# Patient Record
Sex: Male | Born: 1965 | Race: Black or African American | Hispanic: No | Marital: Married | Smoking: Never smoker
Health system: Southern US, Community
[De-identification: ages and names within clinical notes are randomized; demographics above are authoritative.]

## PROBLEM LIST (undated history)

## (undated) DIAGNOSIS — K219 Gastro-esophageal reflux disease without esophagitis: Secondary | ICD-10-CM

## (undated) DIAGNOSIS — E785 Hyperlipidemia, unspecified: Secondary | ICD-10-CM

## (undated) DIAGNOSIS — I10 Essential (primary) hypertension: Secondary | ICD-10-CM

## (undated) HISTORY — DX: Gastro-esophageal reflux disease without esophagitis: K21.9

## (undated) HISTORY — DX: Hyperlipidemia, unspecified: E78.5

## (undated) HISTORY — DX: Essential (primary) hypertension: I10

## (undated) HISTORY — PX: UPPER GI ENDOSCOPY: SHX6162

---

## 2002-12-27 ENCOUNTER — Encounter: Admission: RE | Admit: 2002-12-27 | Discharge: 2002-12-27 | Payer: Self-pay | Admitting: Nephrology

## 2002-12-27 ENCOUNTER — Encounter: Payer: Self-pay | Admitting: Nephrology

## 2003-01-09 ENCOUNTER — Ambulatory Visit (HOSPITAL_COMMUNITY): Admission: RE | Admit: 2003-01-09 | Discharge: 2003-01-09 | Payer: Self-pay | Admitting: Nephrology

## 2003-01-09 ENCOUNTER — Encounter: Payer: Self-pay | Admitting: Nephrology

## 2003-01-14 ENCOUNTER — Ambulatory Visit (HOSPITAL_COMMUNITY): Admission: RE | Admit: 2003-01-14 | Discharge: 2003-01-14 | Payer: Self-pay | Admitting: Nephrology

## 2003-01-14 ENCOUNTER — Encounter: Payer: Self-pay | Admitting: Nephrology

## 2004-06-30 ENCOUNTER — Encounter: Admission: RE | Admit: 2004-06-30 | Discharge: 2004-06-30 | Payer: Self-pay | Admitting: Gastroenterology

## 2004-09-10 ENCOUNTER — Encounter: Admission: RE | Admit: 2004-09-10 | Discharge: 2004-09-10 | Payer: Self-pay | Admitting: Gastroenterology

## 2004-11-25 ENCOUNTER — Encounter: Admission: RE | Admit: 2004-11-25 | Discharge: 2004-11-25 | Payer: Self-pay | Admitting: Gastroenterology

## 2006-01-21 ENCOUNTER — Ambulatory Visit (HOSPITAL_COMMUNITY): Admission: RE | Admit: 2006-01-21 | Discharge: 2006-01-21 | Payer: Self-pay | Admitting: Nephrology

## 2006-01-24 ENCOUNTER — Ambulatory Visit (HOSPITAL_COMMUNITY): Admission: RE | Admit: 2006-01-24 | Discharge: 2006-01-24 | Payer: Self-pay | Admitting: Nephrology

## 2008-02-12 ENCOUNTER — Ambulatory Visit (HOSPITAL_COMMUNITY): Admission: RE | Admit: 2008-02-12 | Discharge: 2008-02-12 | Payer: Self-pay | Admitting: Internal Medicine

## 2008-10-08 ENCOUNTER — Ambulatory Visit: Payer: Self-pay | Admitting: Hematology & Oncology

## 2008-11-22 LAB — CBC WITH DIFFERENTIAL (CANCER CENTER ONLY)
BASO#: 0.1 10*3/uL (ref 0.0–0.2)
Eosinophils Absolute: 0.1 10*3/uL (ref 0.0–0.5)
LYMPH%: 45.4 % (ref 14.0–48.0)
MCH: 30.7 pg (ref 28.0–33.4)
MCV: 91 fL (ref 82–98)
MONO%: 7.1 % (ref 0.0–13.0)
Platelets: 141 10*3/uL — ABNORMAL LOW (ref 145–400)
RBC: 5.26 10*6/uL (ref 4.20–5.70)

## 2008-11-26 LAB — HEPATITIS PANEL, ACUTE
Hep A IgM: NEGATIVE
Hepatitis B Surface Ag: NEGATIVE

## 2008-11-26 LAB — PROTEIN ELECTROPHORESIS, SERUM
Albumin ELP: 58.6 % (ref 55.8–66.1)
Alpha-1-Globulin: 3.8 % (ref 2.9–4.9)
Gamma Globulin: 21.3 % — ABNORMAL HIGH (ref 11.1–18.8)
Total Protein, Serum Electrophoresis: 8.1 g/dL (ref 6.0–8.3)

## 2008-11-26 LAB — ANA: Anti Nuclear Antibody(ANA): NEGATIVE

## 2008-11-26 LAB — FOLATE RBC: RBC Folate: 524 ng/mL (ref 180–600)

## 2008-11-26 LAB — VITAMIN B12: Vitamin B-12: 1137 pg/mL — ABNORMAL HIGH (ref 211–911)

## 2009-03-27 ENCOUNTER — Ambulatory Visit: Payer: Self-pay | Admitting: Hematology & Oncology

## 2010-05-28 ENCOUNTER — Observation Stay (HOSPITAL_COMMUNITY): Admission: EM | Admit: 2010-05-28 | Discharge: 2010-05-29 | Payer: Self-pay | Admitting: Emergency Medicine

## 2010-06-19 ENCOUNTER — Ambulatory Visit: Payer: Self-pay | Admitting: Diagnostic Radiology

## 2010-06-19 ENCOUNTER — Ambulatory Visit (HOSPITAL_BASED_OUTPATIENT_CLINIC_OR_DEPARTMENT_OTHER): Admission: RE | Admit: 2010-06-19 | Discharge: 2010-06-19 | Payer: Self-pay | Admitting: Internal Medicine

## 2010-11-01 ENCOUNTER — Encounter: Payer: Self-pay | Admitting: Nephrology

## 2010-12-25 LAB — DIFFERENTIAL
Basophils Absolute: 0 10*3/uL (ref 0.0–0.1)
Basophils Relative: 0 % (ref 0–1)
Eosinophils Absolute: 0 10*3/uL (ref 0.0–0.7)
Eosinophils Relative: 0 % (ref 0–5)
Lymphocytes Relative: 17 % (ref 12–46)
Lymphs Abs: 1.1 10*3/uL (ref 0.7–4.0)
Monocytes Absolute: 0.4 10*3/uL (ref 0.1–1.0)
Monocytes Relative: 6 % (ref 3–12)
Neutro Abs: 4.9 10*3/uL (ref 1.7–7.7)
Neutrophils Relative %: 77 % (ref 43–77)

## 2010-12-25 LAB — CK TOTAL AND CKMB (NOT AT ARMC)
CK, MB: 2.3 ng/mL (ref 0.3–4.0)
Relative Index: 0.7 (ref 0.0–2.5)
Total CK: 315 U/L — ABNORMAL HIGH (ref 7–232)

## 2010-12-25 LAB — CBC
HCT: 43.8 % (ref 39.0–52.0)
HCT: 44.8 % (ref 39.0–52.0)
Hemoglobin: 15.6 g/dL (ref 13.0–17.0)
Hemoglobin: 16.1 g/dL (ref 13.0–17.0)
MCH: 31 pg (ref 26.0–34.0)
MCH: 31.2 pg (ref 26.0–34.0)
MCHC: 35.6 g/dL (ref 30.0–36.0)
MCHC: 35.9 g/dL (ref 30.0–36.0)
MCV: 86.8 fL (ref 78.0–100.0)
MCV: 86.9 fL (ref 78.0–100.0)
Platelets: 125 10*3/uL — ABNORMAL LOW (ref 150–400)
Platelets: 131 10*3/uL — ABNORMAL LOW (ref 150–400)
RBC: 5.04 MIL/uL (ref 4.22–5.81)
RBC: 5.16 MIL/uL (ref 4.22–5.81)
RDW: 12.4 % (ref 11.5–15.5)
RDW: 12.5 % (ref 11.5–15.5)
WBC: 4.5 10*3/uL (ref 4.0–10.5)
WBC: 6.4 10*3/uL (ref 4.0–10.5)

## 2010-12-25 LAB — BASIC METABOLIC PANEL
BUN: 12 mg/dL (ref 6–23)
CO2: 29 mEq/L (ref 19–32)
Calcium: 9.1 mg/dL (ref 8.4–10.5)
Chloride: 102 mEq/L (ref 96–112)
Creatinine, Ser: 0.92 mg/dL (ref 0.4–1.5)
GFR calc Af Amer: >60 mL/min (ref >60)
GFR calc non Af Amer: >60 mL/min (ref >60)
Glucose, Bld: 98 mg/dL (ref 70–99)
Potassium: 3.1 mEq/L — ABNORMAL LOW (ref 3.5–5.1)
Sodium: 137 mEq/L (ref 135–145)

## 2010-12-25 LAB — COMPREHENSIVE METABOLIC PANEL
AST: 25 U/L (ref 0–37)
Albumin: 4 g/dL (ref 3.5–5.2)
Calcium: 9.2 mg/dL (ref 8.4–10.5)
Creatinine, Ser: 0.79 mg/dL (ref 0.4–1.5)
GFR calc Af Amer: >60 mL/min (ref >60)
GFR calc non Af Amer: >60 mL/min (ref >60)
Sodium: 141 mEq/L (ref 135–145)
Total Protein: 6.8 g/dL (ref 6.0–8.3)

## 2010-12-25 LAB — PROTIME-INR
INR: 1.18 (ref 0.00–1.49)
Prothrombin Time: 15.2 seconds (ref 11.6–15.2)

## 2010-12-25 LAB — LIPID PANEL
Cholesterol: 177 mg/dL (ref 0–200)
HDL: 76 mg/dL (ref >39)
LDL Cholesterol: 96 mg/dL (ref 0–99)
Total CHOL/HDL Ratio: 2.3 RATIO
Triglycerides: 25 mg/dL (ref <150)
VLDL: 5 mg/dL (ref 0–40)

## 2010-12-25 LAB — LIPASE, BLOOD: Lipase: 31 U/L (ref 11–59)

## 2010-12-25 LAB — MAGNESIUM: Magnesium: 2 mg/dL (ref 1.5–2.5)

## 2010-12-25 LAB — MRSA PCR SCREENING: MRSA by PCR: NEGATIVE

## 2010-12-25 LAB — APTT: aPTT: 26 seconds (ref 24–37)

## 2010-12-25 LAB — AMYLASE: Amylase: 87 U/L (ref 0–105)

## 2010-12-25 LAB — TROPONIN I: Troponin I: 0.01 ng/mL (ref 0.00–0.06)

## 2011-02-18 ENCOUNTER — Other Ambulatory Visit: Payer: Self-pay | Admitting: Internal Medicine

## 2011-02-18 ENCOUNTER — Ambulatory Visit
Admission: RE | Admit: 2011-02-18 | Discharge: 2011-02-18 | Disposition: A | Discharge status: Home / Self Care | Payer: 59 | Source: Ambulatory Visit | Attending: Internal Medicine | Admitting: Internal Medicine

## 2011-02-18 DIAGNOSIS — R945 Abnormal results of liver function studies: Secondary | ICD-10-CM

## 2011-02-18 MED ORDER — GADOBENATE DIMEGLUMINE 529 MG/ML IV SOLN
14.0000 mL | Freq: Once | INTRAVENOUS | Status: AC | PRN
  Administered 2011-02-18: 14 mL via INTRAVENOUS

## 2012-05-16 ENCOUNTER — Ambulatory Visit (INDEPENDENT_AMBULATORY_CARE_PROVIDER_SITE_OTHER): Payer: 59 | Admitting: Family Medicine

## 2012-05-16 VITALS — BP 152/98 | HR 91 | Temp 98.2°F | Resp 20 | Ht 63.5 in | Wt 159.0 lb

## 2012-05-16 DIAGNOSIS — N509 Disorder of male genital organs, unspecified: Secondary | ICD-10-CM

## 2012-05-16 DIAGNOSIS — N50819 Testicular pain, unspecified: Secondary | ICD-10-CM

## 2012-05-16 MED ORDER — PREDNISONE 20 MG PO TABS: ORAL_TABLET | ORAL | Status: DC

## 2012-05-16 MED ORDER — CIPROFLOXACIN HCL 500 MG PO TABS: 500.0000 mg | ORAL_TABLET | Freq: Two times a day (BID) | ORAL | Status: DC
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
## 2012-05-21 ENCOUNTER — Ambulatory Visit (INDEPENDENT_AMBULATORY_CARE_PROVIDER_SITE_OTHER): Payer: 59 | Admitting: Family Medicine

## 2012-05-21 VITALS — BP 166/91 | HR 96 | Temp 98.3°F | Resp 18 | Ht 63.0 in | Wt 154.0 lb

## 2012-05-21 DIAGNOSIS — R05 Cough: Secondary | ICD-10-CM

## 2012-05-21 DIAGNOSIS — R059 Cough, unspecified: Secondary | ICD-10-CM

## 2012-05-21 DIAGNOSIS — K219 Gastro-esophageal reflux disease without esophagitis: Secondary | ICD-10-CM

## 2012-05-21 MED ORDER — ESOMEPRAZOLE MAGNESIUM 40 MG PO CPDR: 40.0000 mg | DELAYED_RELEASE_CAPSULE | Freq: Every day | ORAL | Status: DC

## 2012-05-21 MED ORDER — HYDROCODONE-HOMATROPINE 5-1.5 MG/5ML PO SYRP: 5.0000 mL | ORAL_SOLUTION | Freq: Three times a day (TID) | ORAL | Status: AC | PRN

## 2012-05-21 MED ORDER — METHYLPREDNISOLONE ACETATE 80 MG/ML IJ SUSP
80.0000 mg | Freq: Once | INTRAMUSCULAR | Status: AC
  Administered 2012-05-21: 80 mg via INTRAMUSCULAR

## 2012-05-21 NOTE — Progress Notes (Signed)
@[facility] @   Patient ID: Andrew Andrade MRN: [id number], DOB: 1966/01/12, 46 y.o. Date of Encounter: 05/21/2012, 2:59 PM  Primary Physician: No primary provider on file.  Chief Complaint:  Chief Complaint  Patient presents with  . Cough    with congestion and sneezing    HPI: 46 y.o. year old male presents with a 47 day history of nasal congestion, post nasal drip, sore throat, and cough. Mild sinus pressure. Afebrile. No chills. Nasal congestion thick and green/yellow. Cough is productive of green/yellow sputum and not associated with time of day. Ears feel full, leading to sensation of muffled hearing. Has tried OTC cold preps without success. No GI complaints. Appetite fair.  He has reflux symptoms, particularly severe yesterday.  No missed days of work.  No sick contacts, recent antibiotics, or recent travels.   No leg trauma, sedentary periods, h/o cancer, or tobacco use.  No past medical history on file.   Home Meds: Prior to Admission medications   Medication Sig Start Date End Date Taking? Authorizing Provider  amLODipine (NORVASC) 10 MG tablet Take 10 mg by mouth daily.   Yes Historical Provider, MD  atorvastatin (LIPITOR) 20 MG tablet Take 20 mg by mouth daily.   Yes Historical Provider, MD  HYDROcodone-acetaminophen (NORCO) 10-325 MG per tablet Take 1 tablet by mouth every 6 (six) hours as needed.   Yes Historical Provider, MD  losartan (COZAAR) 50 MG tablet Take 50 mg by mouth daily.   Yes Historical Provider, MD  pantoprazole (PROTONIX) 40 MG tablet Take 40 mg by mouth daily.   Yes Historical Provider, MD  ciprofloxacin (CIPRO) 500 MG tablet Take 1 tablet (500 mg total) by mouth 2 (two) times daily. 05/16/12 05/26/12  Elvina Sidle, MD  doxycycline (ORACEA) 40 MG capsule Take 100 mg by mouth every morning.    Historical Provider, MD  predniSONE (DELTASONE) 20 MG tablet 2 daily with food 05/16/12 05/26/12  Elvina Sidle, MD  promethazine (PHENERGAN) 12.5 MG tablet Take 12.5  mg by mouth every 6 (six) hours as needed.    Historical Provider, MD    Allergies: No Known Allergies  History   Social History  . Marital Status: Married    Spouse Name: N/A    Number of Children: N/A  . Years of Education: N/A   Occupational History  . Not on file.   Social History Main Topics  . Smoking status: Never Smoker   . Smokeless tobacco: Not on file  . Alcohol Use: Not on file  . Drug Use: Not on file  . Sexually Active: Not on file   Other Topics Concern  . Not on file   Social History Narrative  . No narrative on file     Review of Systems: Constitutional: negative for chills, fever, night sweats or weight changes Cardiovascular: negative for chest pain or palpitations Respiratory: negative for hemoptysis, wheezing, or shortness of breath Abdominal: negative for abdominal pain, nausea, vomiting or diarrhea Dermatological: negative for rash Neurologic: negative for headache   Physical Exam:  150/92 left arm sitting Blood pressure 166/91, pulse 96, temperature 98.3 F (36.8 C), temperature source Oral, resp. rate 18, height 5\' 3"  (1.6 m), weight 154 lb (69.854 kg)., Body mass index is 27.28 kg/(m^2). General: Well developed, well nourished, in no acute distress. Head: Normocephalic, atraumatic, eyes without discharge, sclera non-icteric, nares are congested. Bilateral auditory canals clear, TM's are without perforation, pearly grey with reflective cone of light bilaterally. No sinus TTP. Oral cavity moist, dentition  normal. Posterior pharynx with post nasal drip and mild erythema. No peritonsillar abscess or tonsillar exudate. Neck: Supple. No thyromegaly. Full ROM. No lymphadenopathy. Lungs: Coarse breath sounds bilaterally without wheezes, rales, or rhonchi. Breathing is unlabored.  Heart: RRR with S1 S2. No murmurs, rubs, or gallops appreciated. Msk:  Strength and tone normal for age. Extremities: No clubbing or cyanosis. No edema. Neuro: Alert and  oriented X 3. Moves all extremities spontaneously. CNII-XII grossly in tact. Psych:  Responds to questions appropriately with a normal affect.   Labs:   ASSESSMENT AND PLAN:  46 y.o. year old male with bronchitis. 1. Cough  methylPREDNISolone acetate (DEPO-MEDROL) injection 80 mg, HYDROcodone-homatropine (HYCODAN) 5-1.5 MG/5ML syrup  2. GERD (gastroesophageal reflux disease)  esomeprazole (NEXIUM) 40 MG capsule     -Tylenol/Motrin prn -Rest/fluids -RTC precautions -RTC 3-5 days if no improvement  Signed, Elvina Sidle, MD 05/21/2012 2:59 PM

## 2013-01-29 ENCOUNTER — Ambulatory Visit (HOSPITAL_COMMUNITY): Admit: 2013-01-29 | Payer: Self-pay | Admitting: Gastroenterology

## 2013-01-29 ENCOUNTER — Encounter (HOSPITAL_COMMUNITY): Payer: Self-pay

## 2013-01-29 SURGERY — MANOMETRY, ESOPHAGUS

## 2013-02-01 ENCOUNTER — Ambulatory Visit (INDEPENDENT_AMBULATORY_CARE_PROVIDER_SITE_OTHER): Payer: 59 | Admitting: Family Medicine

## 2013-02-01 VITALS — BP 148/88 | HR 84 | Temp 98.5°F | Resp 16 | Ht 63.0 in | Wt 155.0 lb

## 2013-02-01 DIAGNOSIS — J0100 Acute maxillary sinusitis, unspecified: Secondary | ICD-10-CM

## 2013-02-01 DIAGNOSIS — J309 Allergic rhinitis, unspecified: Secondary | ICD-10-CM

## 2013-02-01 MED ORDER — FLUTICASONE PROPIONATE 50 MCG/ACT NA SUSP: NASAL | Status: DC

## 2013-02-01 MED ORDER — PREDNISONE 20 MG PO TABS: ORAL_TABLET | ORAL | Status: DC

## 2013-02-01 MED ORDER — AMOXICILLIN-POT CLAVULANATE 875-125 MG PO TABS: 1.0000 | ORAL_TABLET | Freq: Two times a day (BID) | ORAL | Status: DC

## 2013-02-01 MED ORDER — LORATADINE 10 MG PO TABS: 10.0000 mg | ORAL_TABLET | Freq: Every day | ORAL | Status: DC

## 2013-02-01 NOTE — Patient Instructions (Addendum)
Allergic Rhinitis  Allergic rhinitis is when the mucous membranes in the nose respond to allergens. Allergens are particles in the air that cause your body to have an allergic reaction. This causes you to release allergic antibodies. Through a chain of events, these eventually cause you to release histamine into the blood stream (hence the use of antihistamines). Although meant to be protective to the body, it is this release that causes your discomfort, such as frequent sneezing, congestion and an itchy runny nose.    CAUSES    The pollen allergens may come from grasses, trees, and weeds. This is seasonal allergic rhinitis, or "hay fever." Other allergens cause year-round allergic rhinitis (perennial allergic rhinitis) such as house dust mite allergen, pet dander and mold spores.    SYMPTOMS     Nasal stuffiness (congestion).   Runny, itchy nose with sneezing and tearing of the eyes.   There is often an itching of the mouth, eyes and ears.  It cannot be cured, but it can be controlled with medications.  DIAGNOSIS    If you are unable to determine the offending allergen, skin or blood testing may find it.  TREATMENT     Avoid the allergen.   Medications and allergy shots (immunotherapy) can help.   Hay fever may often be treated with antihistamines in pill or nasal spray forms. Antihistamines block the effects of histamine. There are over-the-counter medicines that may help with nasal congestion and swelling around the eyes. Check with your caregiver before taking or giving this medicine.  If the treatment above does not work, there are many new medications your caregiver can prescribe. Stronger medications may be used if initial measures are ineffective. Desensitizing injections can be used if medications and avoidance fails. Desensitization is when a patient is given ongoing shots until the body becomes less sensitive to the allergen. Make sure you follow up with your caregiver if problems continue.   SEEK MEDICAL CARE IF:     You develop fever (more than 100.5 F (38.1 C).   You develop a cough that does not stop easily (persistent).   You have shortness of breath.   You start wheezing.   Symptoms interfere with normal daily activities.  Document Released: 06/22/2001 Document Revised: 12/20/2011 Document Reviewed: 01/01/2009  Exit[employer] Patient Information 2013 [employer].

## 2013-02-01 NOTE — Progress Notes (Signed)
[address]   [place], [place]  [address]   [id number]  Subjective:    Patient ID: Andrew Andrade, male    DOB: Mar 06, 1966, 47 y.o.   MRN: [id number]  HPI This 47 y.o. male presents for evaluation of congestion.  +scratachy throat, coughing.  Onset two weeks ago.  No fever but +chills/sweats.  +HA intermittent.  R ear pain; ear congested.  +nasal congestion; mucous thick yellow.  Intermittent rhinorrhea.  +chest congestion.  No v/d.  No achy.  No medications yet for symptoms.  No tobacco.  Works for [employer].  Outside all day.  +sneezing; +itchy eyes and nose.  Similar symptoms every year.   Review of Systems  Constitutional: Positive for chills, diaphoresis and fatigue. Negative for fever.  HENT: Positive for congestion, rhinorrhea, sneezing, voice change and postnasal drip. Negative for ear pain, sore throat and trouble swallowing.   Respiratory: Positive for cough. Negative for shortness of breath, wheezing and stridor.   Gastrointestinal: Negative for nausea, vomiting, abdominal pain and diarrhea.  Skin: Negative for rash.  Neurological: Positive for headaches. Negative for dizziness and light-headedness.        Past Medical History  Diagnosis Date  . Hyperlipidemia   . Hypertension   . GERD (gastroesophageal reflux disease)     History reviewed. No pertinent past surgical history.  Prior to Admission medications   Medication Sig Start Date End Date Taking? Authorizing Provider  amLODipine (NORVASC) 10 MG tablet Take 10 mg by mouth daily.   Yes Historical Provider, MD  atorvastatin (LIPITOR) 20 MG tablet Take 20 mg by mouth daily.   Yes Historical Provider, MD  olmesartan (BENICAR) 20 MG tablet Take 20 mg by mouth daily.   Yes Historical Provider, MD  pantoprazole (PROTONIX) 40 MG tablet Take 40 mg by mouth 2 (two) times daily.   Yes Historical Provider, MD  amoxicillin-clavulanate (AUGMENTIN) 875-125 MG per tablet Take 1 tablet by mouth 2 (two) times  daily. 02/01/13   Ethelda Chick, MD  fluticasone Aleda Grana) 50 MCG/ACT nasal spray 2 sprays into each nostril daily for allergies 02/01/13   Ethelda Chick, MD  loratadine (CLARITIN) 10 MG tablet Take 1 tablet (10 mg total) by mouth daily. 02/01/13   Ethelda Chick, MD  predniSONE (DELTASONE) 20 MG tablet One pill two times daily x 5 days 02/01/13   Ethelda Chick, MD    No Known Allergies  History   Social History  . Marital Status: Married    Spouse Name: N/A    Number of Children: N/A  . Years of Education: N/A   Occupational History  . Not on file.   Social History Main Topics  . Smoking status: Never Smoker   . Smokeless tobacco: Not on file  . Alcohol Use: Not on file  . Drug Use: Not on file  . Sexually Active: Yes   Other Topics Concern  . Not on file   Social History Narrative   Marital status: married. From [place]; moved to [place] 25 years ago.      Children:  4 daughters; 1 grandchild.      Lives: with wife, youngest daughter.      Employment: works for city of [employer] x 18 years.    History reviewed. No pertinent family history.  Objective:   Physical Exam  Nursing note and vitals reviewed. Constitutional: He is oriented to person, place, and time. He appears well-developed and well-nourished. No distress.  HENT:  Head: Normocephalic and atraumatic.  Right Ear: External ear normal.  Left Ear: External ear normal.  Nose: Mucosal edema and rhinorrhea present. Right sinus exhibits maxillary sinus tenderness. Right sinus exhibits no frontal sinus tenderness. Left sinus exhibits maxillary sinus tenderness. Left sinus exhibits no frontal sinus tenderness.  Mouth/Throat: Oropharynx is clear and moist and mucous membranes are normal.  Eyes: Conjunctivae and EOM are normal. Pupils are equal, round, and reactive to light.  Neck: Normal range of motion. Neck supple.  Cardiovascular: Normal rate, regular rhythm and normal heart sounds.   No murmur  heard. Pulmonary/Chest: Effort normal and breath sounds normal. No respiratory distress. He has no wheezes. He has no rales.  Lymphadenopathy:    He has no cervical adenopathy.  Neurological: He is alert and oriented to person, place, and time.  Skin: He is not diaphoretic.  Psychiatric: He has a normal mood and affect. His behavior is normal.       Results for orders placed in visit on 02/01/13  GLUCOSE, POCT (MANUAL RESULT ENTRY)      Result Value Range   POC Glucose 136 (*) 70 - 99 mg/dl    Assessment & Plan:  Allergic rhinitis - Plan: predniSONE (DELTASONE) 20 MG tablet, fluticasone (FLONASE) 50 MCG/ACT nasal spray, loratadine (CLARITIN) 10 MG tablet, POCT glucose (manual entry)  Sinusitis, acute maxillary - Plan: amoxicillin-clavulanate (AUGMENTIN) 875-125 MG per tablet, predniSONE (DELTASONE) 20 MG tablet    1.  Allergic Rhinitis:  New/uncontrolled; rx for Claritin, Flonase, Prednisone. 2.  Acute maxillary sinusitis:  New.  Rx for Augmentin, Prednisone provided. Supportive care with rest, fluids, Tylenol.   Meds ordered this encounter  Medications  . pantoprazole (PROTONIX) 40 MG tablet    Sig: Take 40 mg by mouth 2 (two) times daily.  Marland Kitchen olmesartan (BENICAR) 20 MG tablet    Sig: Take 20 mg by mouth daily.  Marland Kitchen amoxicillin-clavulanate (AUGMENTIN) 875-125 MG per tablet    Sig: Take 1 tablet by mouth 2 (two) times daily.    Dispense:  20 tablet    Refill:  0  . predniSONE (DELTASONE) 20 MG tablet    Sig: One pill two times daily x 5 days    Dispense:  10 tablet    Refill:  0  . fluticasone (FLONASE) 50 MCG/ACT nasal spray    Sig: 2 sprays into each nostril daily for allergies    Dispense:  16 g    Refill:  6  . loratadine (CLARITIN) 10 MG tablet    Sig: Take 1 tablet (10 mg total) by mouth daily.    Dispense:  30 tablet    Refill:  11

## 2014-03-26 ENCOUNTER — Ambulatory Visit (INDEPENDENT_AMBULATORY_CARE_PROVIDER_SITE_OTHER): Payer: 59 | Admitting: Family Medicine

## 2014-03-26 VITALS — BP 146/92 | HR 81 | Temp 97.8°F | Resp 16 | Ht 63.0 in | Wt 148.6 lb

## 2014-03-26 DIAGNOSIS — S7011XA Contusion of right thigh, initial encounter: Secondary | ICD-10-CM

## 2014-03-26 DIAGNOSIS — S7010XA Contusion of unspecified thigh, initial encounter: Secondary | ICD-10-CM
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
## 2014-03-26 NOTE — Patient Instructions (Signed)
Please apply heat at least every 4 hours while you are awake Take ibuprofen 2-3 tablets every 8 hours for inflammation for the next 24 hours then as needed. Please return to if pain is worse, you develop a fever, or if your leg becomes hot or red.

## 2014-08-26 ENCOUNTER — Ambulatory Visit (INDEPENDENT_AMBULATORY_CARE_PROVIDER_SITE_OTHER): Payer: 59 | Admitting: Internal Medicine

## 2014-08-26 ENCOUNTER — Ambulatory Visit (INDEPENDENT_AMBULATORY_CARE_PROVIDER_SITE_OTHER): Payer: 59

## 2014-08-26 VITALS — BP 136/88 | HR 68 | Temp 98.4°F | Resp 18 | Wt 158.0 lb

## 2014-08-26 DIAGNOSIS — M25512 Pain in left shoulder: Secondary | ICD-10-CM

## 2014-08-26 MED ORDER — MELOXICAM 15 MG PO TABS: 15.0000 mg | ORAL_TABLET | Freq: Every day | ORAL | Status: DC
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
## 2015-07-29 ENCOUNTER — Other Ambulatory Visit: Payer: Self-pay | Admitting: Gastroenterology

## 2015-07-29 DIAGNOSIS — R1310 Dysphagia, unspecified: Secondary | ICD-10-CM

## 2015-08-01 ENCOUNTER — Ambulatory Visit
Admission: RE | Admit: 2015-08-01 | Discharge: 2015-08-01 | Disposition: A | Discharge status: Home / Self Care | Payer: Self-pay | Source: Ambulatory Visit | Attending: Gastroenterology | Admitting: Gastroenterology

## 2015-08-01 DIAGNOSIS — R1310 Dysphagia, unspecified: Secondary | ICD-10-CM

## 2015-09-10 ENCOUNTER — Encounter (HOSPITAL_COMMUNITY)
Admission: RE | Disposition: A | Discharge status: Home / Self Care | Payer: Self-pay | Source: Ambulatory Visit | Attending: Gastroenterology

## 2015-09-10 ENCOUNTER — Ambulatory Visit (HOSPITAL_COMMUNITY)
Admission: RE | Admit: 2015-09-10 | Discharge: 2015-09-10 | Disposition: A | Discharge status: Home / Self Care | Payer: Commercial Managed Care - HMO | Source: Ambulatory Visit | Attending: Gastroenterology | Admitting: Gastroenterology

## 2015-09-10 DIAGNOSIS — K222 Esophageal obstruction: Secondary | ICD-10-CM | POA: Diagnosis not present

## 2015-09-10 DIAGNOSIS — K220 Achalasia of cardia: Secondary | ICD-10-CM | POA: Diagnosis not present

## 2015-09-10 DIAGNOSIS — R1310 Dysphagia, unspecified: Secondary | ICD-10-CM | POA: Diagnosis present

## 2015-09-10 HISTORY — PX: ESOPHAGEAL MANOMETRY: SHX5429

## 2015-09-10 SURGERY — MANOMETRY, ESOPHAGUS

## 2015-09-10 MED ORDER — LIDOCAINE VISCOUS 2 % MT SOLN
OROMUCOSAL | Status: AC
  Filled 2015-09-10: qty 15

## 2015-09-10 SURGICAL SUPPLY — 2 items
FACESHIELD LNG OPTICON STERILE (SAFETY) IMPLANT
GLOVE BIO SURGEON STRL SZ8 (GLOVE) ×6 IMPLANT

## 2015-09-11 ENCOUNTER — Encounter (HOSPITAL_COMMUNITY): Payer: Self-pay | Admitting: Gastroenterology

## 2015-11-27 ENCOUNTER — Ambulatory Visit (INDEPENDENT_AMBULATORY_CARE_PROVIDER_SITE_OTHER): Payer: Commercial Managed Care - HMO | Admitting: Family Medicine

## 2015-11-27 VITALS — BP 167/87 | HR 89 | Temp 101.2°F | Resp 20 | Ht 63.0 in | Wt 157.8 lb

## 2015-11-27 DIAGNOSIS — R6889 Other general symptoms and signs: Secondary | ICD-10-CM | POA: Diagnosis not present

## 2015-11-27 DIAGNOSIS — R05 Cough: Secondary | ICD-10-CM | POA: Diagnosis not present

## 2015-11-27 DIAGNOSIS — R0981 Nasal congestion: Secondary | ICD-10-CM

## 2015-11-27 DIAGNOSIS — R059 Cough, unspecified: Secondary | ICD-10-CM

## 2015-11-27 DIAGNOSIS — R52 Pain, unspecified: Secondary | ICD-10-CM | POA: Diagnosis not present

## 2015-11-27 LAB — POCT INFLUENZA A/B
INFLUENZA B, POC: NEGATIVE
Influenza A, POC: NEGATIVE

## 2015-11-27 MED ORDER — HYDROCODONE-HOMATROPINE 5-1.5 MG/5ML PO SYRP: 5.0000 mL | ORAL_SOLUTION | ORAL | Status: DC | PRN

## 2015-11-27 NOTE — Patient Instructions (Signed)
Drink lots of fluids, including water, juice, soda.  Get plenty of rest and stay off work until Monday  Take acetaminophen (Tylenol) 500 mg 2 pills every 8 hours as needed for fever and aching  If you need something more still for the fever or body aches you can also take ibuprofen (Advil) 200 mg 3 pills every 8 hours with food for fever and aching.  Hycodan 1 teaspoon every 4-6 hours as needed for cough  Take over-the-counter Claritin-D (loratadine D) 1 daily as needed for head congestion  Return if worse

## 2015-11-27 NOTE — Progress Notes (Signed)
Patient ID: Andrew Andrade, male    DOB: Sep 12, 1966  Age: 50 y.o. MRN: [id number]  Chief Complaint  Patient presents with  . Other    chest tightness , x 4 days  . Chills  . Fever  . Nasal Congestion    Subjective:   -year-old man who is here with a history of having been sick since Tuesday. He has had fever, body aches, head congestion and cough. He is not coughing up anything. He does not smoke. He works with [employer]. He worked last night. He says his wife was sick but she is well now. He did get a flu shot this year through the job.  Current allergies, medications, problem list, past/family and social histories reviewed.  Objective:  BP 167/87 mmHg  Pulse 89  Temp(Src) 101.2 F (38.4 C) (Oral)  Resp 20  Ht  (1.6 m)  Wt 157 lb 12.8 oz (71.578 kg)  BMI 27.96 kg/m2  SpO2 98%  Ill-appearing. Warm to touch. TMs normal. Throat clear. Nasal swab taken. Neck supple without significant nodes. Chest clear to auscultation. Heart regular without murmur.  Results for orders placed or performed in visit on 11/27/15  POCT Influenza A/B  Result Value Ref Range   Influenza A, POC Negative Negative   Influenza B, POC Negative Negative     Assessment & Plan:   Assessment: 1. Flu-like symptoms   2. Cough   3. Body aches   4. Nasal congestion       Plan: Flu swab for the flulike illness.  Orders Placed This Encounter  Procedures  . POCT Influenza A/B    Meds ordered this encounter  Medications  . HYDROcodone-homatropine (HYCODAN) 5-1.5 MG/5ML syrup    Sig: Take 5 mLs by mouth every 4 (four) hours as needed.    Dispense:  120 mL    Refill:  0         Patient Instructions  Drink lots of fluids, including water, juice, soda.  Get plenty of rest and stay off work until Monday  Take acetaminophen (Tylenol) 500 mg 2 pills every 8 hours as needed for fever and aching  If you need something more still for the fever or body aches you can also take ibuprofen  (Advil) 200 mg 3 pills every 8 hours with food for fever and aching.  Hycodan 1 teaspoon every 4-6 hours as needed for cough  Take over-the-counter Claritin-D (loratadine D) 1 daily as needed for head congestion  Return if worse     Return if symptoms worsen or fail to improve.   HOPPER,DAVID, MD 11/27/2015

## 2017-01-06 ENCOUNTER — Ambulatory Visit (HOSPITAL_COMMUNITY)
Admission: EM | Admit: 2017-01-06 | Discharge: 2017-01-06 | Disposition: A | Discharge status: Home / Self Care | Payer: Commercial Managed Care - HMO | Attending: Family Medicine | Admitting: Family Medicine

## 2017-01-06 ENCOUNTER — Encounter (HOSPITAL_COMMUNITY): Payer: Self-pay | Admitting: Emergency Medicine

## 2017-01-06 DIAGNOSIS — R109 Unspecified abdominal pain: Secondary | ICD-10-CM | POA: Diagnosis not present

## 2017-01-06 LAB — POCT URINALYSIS DIP (DEVICE)
BILIRUBIN URINE: NEGATIVE
GLUCOSE, UA: NEGATIVE mg/dL
Ketones, ur: NEGATIVE mg/dL
LEUKOCYTES UA: NEGATIVE
NITRITE: NEGATIVE
Protein, ur: NEGATIVE mg/dL
Specific Gravity, Urine: 1.015 (ref 1.005–1.030)
UROBILINOGEN UA: 0.2 mg/dL (ref 0.0–1.0)
pH: 7.5 (ref 5.0–8.0)

## 2017-01-06 MED ORDER — NAPROXEN 500 MG PO TABS: 500.0000 mg | ORAL_TABLET | Freq: Two times a day (BID) | ORAL | 0 refills | Status: DC

## 2017-01-06 MED ORDER — PANTOPRAZOLE SODIUM 40 MG PO TBEC: 40.0000 mg | DELAYED_RELEASE_TABLET | Freq: Two times a day (BID) | ORAL | 0 refills | Status: DC

## 2017-01-06 NOTE — ED Triage Notes (Signed)
Here for constant left flank pain onset 1 week... sts pain is constant and increases more when he bends over  Denies f/v/n/d  LBM = today... Did not notice any abnormalities  A&O x4... NAD

## 2017-01-06 NOTE — ED Provider Notes (Signed)
CSN: [id number]     Arrival date & time 01/06/17  1157 History   First MD Initiated Contact with Patient 01/06/17 1213     Chief Complaint  Patient presents with  . Flank Pain   (Consider location/radiation/quality/duration/timing/severity/associated sxs/prior Treatment) Patient c/o left flank pain for a week.  He states it hurts more with movement.   The history is provided by the patient.  Flank Pain  This is a new problem. The current episode started more than 1 week ago. The problem occurs constantly. The problem has not changed since onset.Nothing aggravates the symptoms. Nothing relieves the symptoms. He has tried nothing for the symptoms.    Past Medical History:  Diagnosis Date  . GERD (gastroesophageal reflux disease)   . Hyperlipidemia   . Hypertension    Past Surgical History:  Procedure Laterality Date  . ESOPHAGEAL MANOMETRY N/A 09/10/2015   Procedure: ESOPHAGEAL MANOMETRY (EM);  Surgeon: Carman ChingJames Edwards, MD;  Location: [facility];  Service: Endoscopy;  Laterality: N/A;   History reviewed. No pertinent family history. Social History  Substance Use Topics  . Smoking status: Never Smoker  . Smokeless tobacco: Never Used  . Alcohol use No    Review of Systems  Constitutional: Negative.   HENT: Negative.   Eyes: Negative.   Respiratory: Negative.   Cardiovascular: Negative.   Gastrointestinal: Negative.   Endocrine: Negative.   Genitourinary: Positive for flank pain.  Musculoskeletal: Positive for arthralgias.  Allergic/Immunologic: Negative.   Neurological: Negative.   Hematological: Negative.   Psychiatric/Behavioral: Negative.     Allergies  Patient has no known allergies.  Home Medications   Prior to Admission medications   Medication Sig Start Date End Date Taking? Authorizing Provider  amLODipine (NORVASC) 10 MG tablet Take 10 mg by mouth daily.   Yes Historical Provider, MD  atorvastatin (LIPITOR) 20 MG tablet Take 20 mg by mouth daily.  Reported on 11/27/2015   Yes Historical Provider, MD  pantoprazole (PROTONIX) 40 MG tablet Take 40 mg by mouth 2 (two) times daily.   Yes Historical Provider, MD  HYDROcodone-homatropine (HYCODAN) 5-1.5 MG/5ML syrup Take 5 mLs by mouth every 4 (four) hours as needed. 11/27/15   Peyton Najjaravid H Hopper, MD  meloxicam (MOBIC) 15 MG tablet Take 1 tablet (15 mg total) by mouth daily. Patient not taking: Reported on 11/27/2015 08/26/14   Tonye Pearsonobert P Doolittle, MD  naproxen (NAPROSYN) 500 MG tablet Take 1 tablet (500 mg total) by mouth 2 (two) times daily with a meal. 01/06/17   Deatra CanterWilliam J Alnita Aybar, FNP  olmesartan (BENICAR) 20 MG tablet Take 20 mg by mouth daily.    Historical Provider, MD   Meds Ordered and Administered this Visit  Medications - No data to display  BP (!) 146/69 (BP Location: Right Arm)   Pulse 74   Temp 98.2 F (36.8 C) (Oral)   Resp 16   SpO2 100%  No data found.   Physical Exam  Constitutional: He is oriented to person, place, and time. He appears well-developed and well-nourished.  HENT:  Head: Normocephalic and atraumatic.  Eyes: EOM are normal. Pupils are equal, round, and reactive to light.  Neck: Normal range of motion. Neck supple.  Cardiovascular: Normal rate, regular rhythm and normal heart sounds.   Pulmonary/Chest: Effort normal and breath sounds normal.  Abdominal: Soft. Bowel sounds are normal.  Musculoskeletal: He exhibits tenderness.  TTP left hip and left abdominal muscles.  Neurological: He is alert and oriented to person, place, and time.  Nursing  note and vitals reviewed.   Urgent Care Course     Procedures (including critical care time)  Labs Review Labs Reviewed  POCT URINALYSIS DIP (DEVICE) - Abnormal; Notable for the following:       Result Value   Hgb urine dipstick TRACE (*)    All other components within normal limits    Imaging Review No results found.   Visual Acuity Review  Right Eye Distance:   Left Eye Distance:   Bilateral Distance:     Right Eye Near:   Left Eye Near:    Bilateral Near:         MDM   1. Left flank pain    Naprosyn 500 mg po bid x 7 days #14      Deatra Canter, FNP 01/06/17 1241

## 2017-01-13 DIAGNOSIS — E559 Vitamin D deficiency, unspecified: Secondary | ICD-10-CM | POA: Diagnosis not present

## 2017-01-13 DIAGNOSIS — E7410 Disorder of fructose metabolism, unspecified: Secondary | ICD-10-CM | POA: Diagnosis not present

## 2017-01-13 DIAGNOSIS — E785 Hyperlipidemia, unspecified: Secondary | ICD-10-CM | POA: Diagnosis not present

## 2017-01-13 DIAGNOSIS — I119 Hypertensive heart disease without heart failure: Secondary | ICD-10-CM | POA: Diagnosis not present

## 2017-01-13 DIAGNOSIS — R109 Unspecified abdominal pain: Secondary | ICD-10-CM | POA: Diagnosis not present

## 2017-01-25 ENCOUNTER — Telehealth: Payer: Self-pay | Admitting: General Practice

## 2017-01-25 NOTE — Telephone Encounter (Signed)
Attempted to call patient in regards to establishing care with our location for a primary care provider. Awaiting patient's call back, he was on the way to work.

## 2017-03-29 DIAGNOSIS — I10 Essential (primary) hypertension: Secondary | ICD-10-CM | POA: Diagnosis not present

## 2017-03-29 DIAGNOSIS — Z0000 Encounter for general adult medical examination without abnormal findings: Secondary | ICD-10-CM | POA: Diagnosis not present

## 2017-03-29 DIAGNOSIS — H538 Other visual disturbances: Secondary | ICD-10-CM | POA: Diagnosis not present

## 2017-03-29 DIAGNOSIS — Z01118 Encounter for examination of ears and hearing with other abnormal findings: Secondary | ICD-10-CM | POA: Diagnosis not present

## 2017-03-29 DIAGNOSIS — E785 Hyperlipidemia, unspecified: Secondary | ICD-10-CM | POA: Diagnosis not present

## 2017-03-29 DIAGNOSIS — Z131 Encounter for screening for diabetes mellitus: Secondary | ICD-10-CM | POA: Diagnosis not present

## 2017-03-29 DIAGNOSIS — I119 Hypertensive heart disease without heart failure: Secondary | ICD-10-CM | POA: Diagnosis not present

## 2017-05-13 DIAGNOSIS — H40013 Open angle with borderline findings, low risk, bilateral: Secondary | ICD-10-CM | POA: Diagnosis not present

## 2017-05-13 DIAGNOSIS — H04123 Dry eye syndrome of bilateral lacrimal glands: Secondary | ICD-10-CM | POA: Diagnosis not present

## 2017-05-13 DIAGNOSIS — H401131 Primary open-angle glaucoma, bilateral, mild stage: Secondary | ICD-10-CM | POA: Diagnosis not present

## 2017-07-01 DIAGNOSIS — I10 Essential (primary) hypertension: Secondary | ICD-10-CM | POA: Diagnosis not present

## 2017-07-01 DIAGNOSIS — I119 Hypertensive heart disease without heart failure: Secondary | ICD-10-CM | POA: Diagnosis not present

## 2017-07-01 DIAGNOSIS — E785 Hyperlipidemia, unspecified: Secondary | ICD-10-CM | POA: Diagnosis not present

## 2017-08-26 DIAGNOSIS — K220 Achalasia of cardia: Secondary | ICD-10-CM | POA: Diagnosis not present

## 2017-09-16 DIAGNOSIS — I119 Hypertensive heart disease without heart failure: Secondary | ICD-10-CM | POA: Diagnosis not present

## 2017-09-16 DIAGNOSIS — E785 Hyperlipidemia, unspecified: Secondary | ICD-10-CM | POA: Diagnosis not present

## 2017-09-16 DIAGNOSIS — I10 Essential (primary) hypertension: Secondary | ICD-10-CM | POA: Diagnosis not present

## 2017-09-16 DIAGNOSIS — Z7689 Persons encountering health services in other specified circumstances: Secondary | ICD-10-CM | POA: Diagnosis not present

## 2017-09-23 DIAGNOSIS — N289 Disorder of kidney and ureter, unspecified: Secondary | ICD-10-CM | POA: Diagnosis not present

## 2017-09-23 DIAGNOSIS — I10 Essential (primary) hypertension: Secondary | ICD-10-CM | POA: Diagnosis not present

## 2017-09-23 DIAGNOSIS — I119 Hypertensive heart disease without heart failure: Secondary | ICD-10-CM | POA: Diagnosis not present

## 2017-09-28 ENCOUNTER — Ambulatory Visit (HOSPITAL_COMMUNITY)
Admission: EM | Admit: 2017-09-28 | Discharge: 2017-09-28 | Disposition: A | Discharge status: Home / Self Care | Payer: 59 | Attending: Family Medicine | Admitting: Family Medicine

## 2017-09-28 ENCOUNTER — Other Ambulatory Visit: Payer: Self-pay

## 2017-09-28 ENCOUNTER — Encounter (HOSPITAL_COMMUNITY): Payer: Self-pay | Admitting: Emergency Medicine

## 2017-09-28 DIAGNOSIS — R69 Illness, unspecified: Secondary | ICD-10-CM | POA: Diagnosis not present

## 2017-09-28 DIAGNOSIS — J111 Influenza due to unidentified influenza virus with other respiratory manifestations: Secondary | ICD-10-CM

## 2017-09-28 MED ORDER — OSELTAMIVIR PHOSPHATE 75 MG PO CAPS: 75.0000 mg | ORAL_CAPSULE | Freq: Two times a day (BID) | ORAL | 0 refills | Status: AC

## 2017-09-28 MED ORDER — ACETAMINOPHEN 325 MG PO TABS
ORAL_TABLET | ORAL | Status: AC
  Filled 2017-09-28: qty 2

## 2017-09-28 MED ORDER — IBUPROFEN 600 MG PO TABS: 600.0000 mg | ORAL_TABLET | Freq: Four times a day (QID) | ORAL | 0 refills | Status: DC | PRN

## 2017-09-28 MED ORDER — ACETAMINOPHEN 325 MG PO TABS
650.0000 mg | ORAL_TABLET | Freq: Once | ORAL | Status: AC
  Administered 2017-09-28: 650 mg via ORAL

## 2017-09-28 NOTE — ED Triage Notes (Signed)
Pt reports nasal congestion, sneezing and h/a since yesterday.

## 2017-09-28 NOTE — Discharge Instructions (Signed)
Push fluids to ensure adequate hydration and keep secretions thin.  Tylenol and/or ibuprofen as needed for pain or fevers.  May use over the counter mucinex, nasal sprays or cough drops as needed for symptoms. Complete course of tamiflu, may cause stomach upset.  If symptoms worsen or do not improve in the next week to return to be seen or to follow up with PCP.

## 2017-09-28 NOTE — ED Provider Notes (Signed)
[facility]    CSN: [id number] Arrival date & time: 09/28/17  1716     History   Chief Complaint Chief Complaint  Patient presents with  . URI    HPI Andrew CableDaniel A Andrade is a 51 y.o. male.   Andrew BoomDaniel presents with complaints of cough, congestion, fever, sneezing and body aches which started yesterday. No known ill contacts. Without gi/gu complaints. Denies chest pain or shortness of breath. Took tylenol this morning which did not help, then worked all day today. States he has decreased urination. Mild sore throat. Denies ear pain. No known ill contacts. History of hypertension and hyperlipidemia.    ROS per HPI.       Past Medical History:  Diagnosis Date  . GERD (gastroesophageal reflux disease)   . Hyperlipidemia   . Hypertension     There are no active problems to display for this patient.   Past Surgical History:  Procedure Laterality Date  . ESOPHAGEAL MANOMETRY N/A 09/10/2015   Procedure: ESOPHAGEAL MANOMETRY (EM);  Surgeon: Carman ChingJames Edwards, MD;  Location: [facility];  Service: Endoscopy;  Laterality: N/A;       Home Medications    Prior to Admission medications   Medication Sig Start Date End Date Taking? Authorizing Provider  amLODipine (NORVASC) 10 MG tablet Take 10 mg by mouth daily.   Yes [provider]  atorvastatin (LIPITOR) 40 MG tablet Take 40 mg by mouth daily.  09/10/17  Yes [provider]  cholecalciferol (VITAMIN D) 1000 units tablet Take 1,000 Units by mouth daily.   Yes [provider]  olmesartan (BENICAR) 40 MG tablet Take 40 mg by mouth daily. 09/16/17  Yes [provider]  pantoprazole (PROTONIX) 40 MG tablet Take 1 tablet (40 mg total) by mouth 2 (two) times daily. 01/06/17  Yes [name], [name], FNP  ibuprofen (ADVIL,MOTRIN) 600 MG tablet Take 1 tablet (600 mg total) by mouth every 6 (six) hours as needed. 09/28/17   [name], [name], NP  oseltamivir (TAMIFLU) 75 MG capsule Take 1 capsule  (75 mg total) by mouth every 12 (twelve) hours for 5 days. 09/28/17 10/03/17  [name], [name], NP    Family History History reviewed. No pertinent family history.  Social History Social History   Tobacco Use  . Smoking status: Never Smoker  . Smokeless tobacco: Never Used  Substance Use Topics  . Alcohol use: No  . Drug use: Not on file     Allergies   Patient has no known allergies.   Review of Systems Review of Systems   Physical Exam Triage Vital Signs ED Triage Vitals  Enc Vitals Group     BP 09/28/17 1804 (!) 154/85     Pulse Rate 09/28/17 1804 (!) 109     Resp --      Temp 09/28/17 1804 (!) 102 F (38.9 C)     Temp Source 09/28/17 1804 Oral     SpO2 09/28/17 1804 93 %     Weight --      Height --      Head Circumference --      Peak Flow --      Pain Score 09/28/17 1802 9     Pain Loc --      Pain Edu? --      Excl. in GC? --    No data found.  Updated Vital Signs BP (!) 154/85 (BP Location: Left Arm)   Pulse (!) 109   Temp (!) 102  F (38.9 C) (Oral)   SpO2 93%   Visual Acuity Right Eye Distance:   Left Eye Distance:   Bilateral Distance:    Right Eye Near:   Left Eye Near:    Bilateral Near:     Physical Exam  Constitutional: He is oriented to person, place, and time. He appears well-developed and well-nourished. He has a sickly appearance.  HENT:  Head: Normocephalic and atraumatic.  Right Ear: Tympanic membrane, external ear and ear canal normal.  Left Ear: Tympanic membrane, external ear and ear canal normal.  Nose: Rhinorrhea present. Right sinus exhibits no maxillary sinus tenderness and no frontal sinus tenderness. Left sinus exhibits no maxillary sinus tenderness and no frontal sinus tenderness.  Mouth/Throat: Uvula is midline, oropharynx is clear and moist and mucous membranes are normal.  Eyes: Conjunctivae are normal. Pupils are equal, round, and reactive to light.  Neck: Normal range of motion.  Cardiovascular: Normal rate  and regular rhythm.  Pulmonary/Chest: Effort normal and breath sounds normal.  Strong congested cough noted  Lymphadenopathy:    He has no cervical adenopathy.  Neurological: He is alert and oriented to person, place, and time.  Skin: Skin is warm and dry.  Vitals reviewed.    UC Treatments / Results  Labs (all labs ordered are listed, but only abnormal results are displayed) Labs Reviewed - No data to display  EKG  EKG Interpretation None       Radiology No results found.  Procedures Procedures (including critical care time)  Medications Ordered in UC Medications  acetaminophen (TYLENOL) tablet 650 mg (650 mg Oral Given 09/28/17 1808)     Initial Impression / Assessment and Plan / UC Course  I have reviewed the triage vital signs and the nursing notes.  Pertinent labs & imaging results that were available during my care of the patient were reviewed by me and considered in my medical decision making (see chart for details).     History and physical appearance consistent with influenza. Symptoms started within 24 hours, tamiflu provided today. Push fluids to ensure adequate hydration and keep secretions thin. Tylenol and/or ibuprofen as needed for pain or fevers.  Return precautions provided. Patient verbalized understanding and agreeable to plan.    Final Clinical Impressions(s) / UC Diagnoses   Final diagnoses:  Influenza-like illness    ED Discharge Orders        Ordered    oseltamivir (TAMIFLU) 75 MG capsule  Every 12 hours     09/28/17 1825    ibuprofen (ADVIL,MOTRIN) 600 MG tablet  Every 6 hours PRN     09/28/17 1825       Controlled Substance Prescriptions [place] Controlled Substance Registry consulted? Not Applicable   [name], [name], NP 09/28/17 [phone]

## 2017-09-30 NOTE — Pre-Procedure Instructions (Signed)
Sent message in epic requesting pre op orders from Dr. Daphine DeutscherMartin.

## 2017-09-30 NOTE — Patient Instructions (Addendum)
Your procedure is scheduled on: Friday, Jan. 4, 2019   Surgery Time:  7:30 AM-9:30AM   [facility]   Take [place]  elevators to 3rd floor to  Short Stay Center at 5:30 AM.   Call this number if you have problems the morning of surgery [phone]    Remember: ONLY 1 PERSON MAY GO WITH YOU TO SHORT STAY TO GET  READY MORNING OF YOUR SURGERY.   Do not eat food or drink liquids :After Midnight.   Do NOT smoke after Midnight   Take these medicines the morning of surgery with A SIP OF WATER: Amlodipine, Pantoprazole                               You may not have any metal on your body including jewelry, and body piercings             Do not wear lotions, powders, perfumes,or deodorant                          Men may shave face and neck.   Do not bring valuables to the hospital. [place] IS NOT             RESPONSIBLE   FOR VALUABLES.   Contacts, dentures or bridgework may not be worn into surgery.   Leave suitcase in the car. After surgery it may be brought to your room.   Special Instructions: Bring a copy of your healthcare power of attorney and living will documents the day of surgery if you haven't scanned them in before.              Please read over the following fact sheets you were given:  [facility] - Preparing for Surgery Before surgery, you can play an important role.  Because skin is not sterile, your skin needs to be as free of germs as possible.  You can reduce the number of germs on your skin by washing with CHG (chlorahexidine gluconate) soap before surgery.  CHG is an antiseptic cleaner which kills germs and bonds with the skin to continue killing germs even after washing. Please DO NOT use if you have an allergy to CHG or antibacterial soaps.  If your skin becomes reddened/irritated stop using the CHG and inform your nurse when you arrive at Short Stay. Do not shave (including legs and underarms) for at least 48 hours prior  to the first CHG shower.  You may shave your face/neck.  Please follow these instructions carefully:  1.  Shower with CHG Soap the night before surgery and the  morning of surgery.  2.  If you choose to wash your hair, wash your hair first as usual with your normal  shampoo.  3.  After you shampoo, rinse your hair and body thoroughly to remove the shampoo.                             4.  Use CHG as you would any other liquid soap.  You can apply chg directly to the skin and wash.  Gently with a scrungie or clean washcloth.  5.  Apply the CHG Soap to your body ONLY FROM THE NECK DOWN.   Do   not use on face/ open  Wound or open sores. Avoid contact with eyes, ears mouth and   genitals (private parts).                       Wash face,  Genitals (private parts) with your normal soap.             6.  Wash thoroughly, paying special attention to the area where your    surgery  will be performed.  7.  Thoroughly rinse your body with warm water from the neck down.  8.  DO NOT shower/wash with your normal soap after using and rinsing off the CHG Soap.                9.  Pat yourself dry with a clean towel.            10.  Wear clean pajamas.            11.  Place clean sheets on your bed the night of your first shower and do not  sleep with pets. Day of Surgery : Do not apply any lotions/deodorants the morning of surgery.  Please wear clean clothes to the hospital/surgery center.  FAILURE TO FOLLOW THESE INSTRUCTIONS MAY RESULT IN THE CANCELLATION OF YOUR SURGERY  PATIENT SIGNATURE_________________________________  NURSE SIGNATURE__________________________________  ________________________________________________________________________

## 2017-10-07 ENCOUNTER — Encounter (HOSPITAL_COMMUNITY): Payer: Self-pay

## 2017-10-07 ENCOUNTER — Encounter (HOSPITAL_COMMUNITY)
Admission: RE | Admit: 2017-10-07 | Discharge: 2017-10-07 | Disposition: A | Discharge status: Home / Self Care | Payer: 59 | Source: Ambulatory Visit | Attending: Surgery | Admitting: Surgery

## 2017-10-07 ENCOUNTER — Other Ambulatory Visit: Payer: Self-pay

## 2017-10-07 DIAGNOSIS — I517 Cardiomegaly: Secondary | ICD-10-CM | POA: Diagnosis not present

## 2017-10-07 DIAGNOSIS — R9431 Abnormal electrocardiogram [ECG] [EKG]: Secondary | ICD-10-CM | POA: Insufficient documentation

## 2017-10-07 DIAGNOSIS — Z01818 Encounter for other preprocedural examination: Secondary | ICD-10-CM | POA: Diagnosis not present

## 2017-10-07 LAB — CBC
HEMATOCRIT: 45 % (ref 39.0–52.0)
HEMOGLOBIN: 15.5 g/dL (ref 13.0–17.0)
MCH: 30.8 pg (ref 26.0–34.0)
MCHC: 34.4 g/dL (ref 30.0–36.0)
MCV: 89.3 fL (ref 78.0–100.0)
PLATELETS: 175 10*3/uL (ref 150–400)
RBC: 5.04 MIL/uL (ref 4.22–5.81)
RDW: 12.3 % (ref 11.5–15.5)
WBC: 3.1 10*3/uL — AB (ref 4.0–10.5)

## 2017-10-07 LAB — BASIC METABOLIC PANEL
ANION GAP: 6 (ref 5–15)
BUN: 13 mg/dL (ref 6–20)
CHLORIDE: 101 mmol/L (ref 101–111)
CO2: 32 mmol/L (ref 22–32)
Calcium: 9.8 mg/dL (ref 8.9–10.3)
Creatinine, Ser: 1.09 mg/dL (ref 0.61–1.24)
GFR calc non Af Amer: >60 mL/min (ref >60)
Glucose, Bld: 104 mg/dL — ABNORMAL HIGH (ref 65–99)
POTASSIUM: 3.9 mmol/L (ref 3.5–5.1)
SODIUM: 139 mmol/L (ref 135–145)

## 2017-10-07 NOTE — Pre-Procedure Instructions (Signed)
Requested orders from Dr. Daphine DeutscherMartin via epic.

## 2017-10-07 NOTE — Pre-Procedure Instructions (Signed)
CBC and BMP results 10/07/17 faxed to Dr. Daphine DeutscherMartin via epic.

## 2017-10-07 NOTE — Pre-Procedure Instructions (Signed)
Dr. C. Jackson reviewed EKG no new orders received at this time. 

## 2017-11-02 NOTE — Progress Notes (Signed)
10-07-17 (Epic) EKG

## 2017-11-02 NOTE — Patient Instructions (Signed)
Sharyon CableDaniel A Pidcock  11/02/2017   Your procedure is scheduled on: 11-11-17   Report [facility]  Entrance Follow signs to Short Stay on first floor at 5:30 AM    Call this number if you have problems the morning of surgery [phone]   Remember: Do not eat food or drink liquids :After Midnight.     Take these medicines the morning of surgery with A SIP OF WATER: Amlodipine (Norvasc), and Pantoprazole (Protonix)                                You may not have any metal on your body including hair pins and              piercings  Do not wear jewelry, lotions, powders or deodorant             Men may shave face and neck.   Do not bring valuables to the hospital. [place] IS NOT             RESPONSIBLE   FOR VALUABLES.  Contacts, dentures or bridgework may not be worn into surgery.  Leave suitcase in the car. After surgery it may be brought to your room.                Please read over the following fact sheets you were given: _____________________________________________________________________          [facility] - Preparing for Surgery Before surgery, you can play an important role.  Because skin is not sterile, your skin needs to be as free of germs as possible.  You can reduce the number of germs on your skin by washing with CHG (chlorahexidine gluconate) soap before surgery.  CHG is an antiseptic cleaner which kills germs and bonds with the skin to continue killing germs even after washing. Please DO NOT use if you have an allergy to CHG or antibacterial soaps.  If your skin becomes reddened/irritated stop using the CHG and inform your nurse when you arrive at Short Stay. Do not shave (including legs and underarms) for at least 48 hours prior to the first CHG shower.  You may shave your face/neck. Please follow these instructions carefully:  1.  Shower with CHG Soap the night before surgery and the  morning of Surgery.  2.  If you choose to wash your  hair, wash your hair first as usual with your  normal  shampoo.  3.  After you shampoo, rinse your hair and body thoroughly to remove the  shampoo.                           4.  Use CHG as you would any other liquid soap.  You can apply chg directly  to the skin and wash                       Gently with a scrungie or clean washcloth.  5.  Apply the CHG Soap to your body ONLY FROM THE NECK DOWN.   Do not use on face/ open                           Wound or open sores. Avoid contact with eyes, ears  mouth and genitals (private parts).                       Wash face,  Genitals (private parts) with your normal soap.             6.  Wash thoroughly, paying special attention to the area where your surgery  will be performed.  7.  Thoroughly rinse your body with warm water from the neck down.  8.  DO NOT shower/wash with your normal soap after using and rinsing off  the CHG Soap.                9.  Pat yourself dry with a clean towel.            10.  Wear clean pajamas.            11.  Place clean sheets on your bed the night of your first shower and do not  sleep with pets. Day of Surgery : Do not apply any lotions/deodorants the morning of surgery.  Please wear clean clothes to the hospital/surgery center.  FAILURE TO FOLLOW THESE INSTRUCTIONS MAY RESULT IN THE CANCELLATION OF YOUR SURGERY PATIENT SIGNATURE_________________________________  NURSE SIGNATURE__________________________________  ________________________________________________________________________

## 2017-11-04 ENCOUNTER — Encounter (HOSPITAL_COMMUNITY)
Admission: RE | Admit: 2017-11-04 | Discharge: 2017-11-04 | Disposition: A | Discharge status: Home / Self Care | Payer: 59 | Source: Ambulatory Visit | Attending: Surgery | Admitting: Surgery

## 2017-11-04 ENCOUNTER — Encounter (HOSPITAL_COMMUNITY): Payer: Self-pay

## 2017-11-04 ENCOUNTER — Other Ambulatory Visit: Payer: Self-pay

## 2017-11-04 DIAGNOSIS — Z01812 Encounter for preprocedural laboratory examination: Secondary | ICD-10-CM | POA: Insufficient documentation

## 2017-11-04 LAB — BASIC METABOLIC PANEL
Anion gap: 6 (ref 5–15)
BUN: 13 mg/dL (ref 6–20)
CALCIUM: 9.4 mg/dL (ref 8.9–10.3)
CHLORIDE: 104 mmol/L (ref 101–111)
CO2: 29 mmol/L (ref 22–32)
CREATININE: 1.08 mg/dL (ref 0.61–1.24)
GFR calc non Af Amer: >60 mL/min (ref >60)
Glucose, Bld: 90 mg/dL (ref 65–99)
Potassium: 3.7 mmol/L (ref 3.5–5.1)
Sodium: 139 mmol/L (ref 135–145)

## 2017-11-04 LAB — CBC
HCT: 42.6 % (ref 39.0–52.0)
Hemoglobin: 14.7 g/dL (ref 13.0–17.0)
MCH: 30.8 pg (ref 26.0–34.0)
MCHC: 34.5 g/dL (ref 30.0–36.0)
MCV: 89.3 fL (ref 78.0–100.0)
PLATELETS: 114 10*3/uL — AB (ref 150–400)
RBC: 4.77 MIL/uL (ref 4.22–5.81)
RDW: 12.7 % (ref 11.5–15.5)
WBC: 2.6 10*3/uL — ABNORMAL LOW (ref 4.0–10.5)

## 2017-11-04 NOTE — Progress Notes (Signed)
11-04-17 CBC result, routed to Dr. Daphine DeutscherMartin for review.

## 2017-11-09 ENCOUNTER — Other Ambulatory Visit: Payer: Self-pay | Admitting: Surgery

## 2017-11-10 NOTE — H&P (Signed)
Andrew Andrade [place] [facility] Patient #: [id number] DOB: 07/23/1966 Married / Language: English / Race: Black or African American Male  CC  Referred for achalasia History of Present Illness  The patient is a 52 year old male who presents with dysphagia. The patient is a 52 year old man from [place] originally referred for management of achalasia by Dr. Randa EvensEdwards. I had spoken with Dr. Randa EvensEdwards about this patient previously I reviewed his upper GI series. He describes his symptoms as evolving and getting severe over the last 6 months. Times foods will hang up and he has resorted to chewing them better and then washing them down with water. He does spit up. He has not had aspiration pneumonia as yet. He says he does have pain that he thinks is heartburn for which she takes antireflux medicine.  His upper GI shows typical bird beak narrowing and I reviewed his manometry which shows persistently elevated lower esophageal sphincter pressures without relaxation. His upper GI also didn't show much in the way motility in the esophageal body.  I think he could return benefit from a laparoscopic Heller myotomy possibly with a fundoplication. I described this operation to him in some detail including risk of perforation a wrap failure reflux. He has a pending wetting in April and May with his daughter. His wife is somewhat opposed to him having [facility]. I told him we should go ahead and try to get him in touch with our schedulers and should he decide to go ahead and move forward than we would able to go ahead and proceed accommodate him on the surgical schedule.  He waited 2 years from the time of initial consultation and his symptoms have not gotten better.     Other Problems Gastroesophageal Reflux Disease  High blood pressure  Hypercholesterolemia   Past Surgical History  No pertinent past surgical history   Diagnostic Studies History Colonoscopy  within last  year  Allergies  No Known Drug Allergies 10/30/2015  Medication History Pantoprazole Sodium (40MG  Tablet DR, Oral) Active. Amlodipine-Atorvastatin (10-10MG  Tablet, Oral) Active. Benicar (20MG  Tablet, Oral) Active. Meloxicam (15MG  Tablet, Oral) Active. Medications Reconciled  Social History  No alcohol use  No caffeine use  No drug use  Tobacco use  Never smoker.  Family History  Hypertension  Father.  Review of Systems  General Present- Appetite Loss. Not Present- Chills, Fatigue, Fever, Night Sweats, Weight Gain and Weight Loss. Skin Not Present- Change in Wart/Mole, Dryness, Hives, Jaundice, New Lesions, Non-Healing Wounds, Rash and Ulcer. HEENT Present- Sore Throat. Not Present- Earache, Hearing Loss, Hoarseness, Nose Bleed, Oral Ulcers, Ringing in the Ears, Seasonal Allergies, Sinus Pain, Visual Disturbances, Wears glasses/contact lenses and Yellow Eyes. Respiratory Not Present- Bloody sputum, Chronic Cough, Difficulty Breathing, Snoring and Wheezing. Breast Not Present- Breast Mass, Breast Pain, Nipple Discharge and Skin Changes. Cardiovascular Not Present- Chest Pain, Difficulty Breathing Lying Down, Leg Cramps, Palpitations, Rapid Heart Rate, Shortness of Breath and Swelling of Extremities. Gastrointestinal Present- Change in Bowel Habits, Constipation, Difficulty Swallowing, Excessive gas and Gets full quickly at meals. Not Present- Abdominal Pain, Bloating, Bloody Stool, Chronic diarrhea, Hemorrhoids, Indigestion, Nausea, Rectal Pain and Vomiting. Male Genitourinary Not Present- Blood in Urine, Change in Urinary Stream, Frequency, Impotence, Nocturia, Painful Urination, Urgency and Urine Leakage. Musculoskeletal Not Present- Back Pain, Joint Pain, Joint Stiffness, Muscle Pain, Muscle Weakness and Swelling of Extremities. Neurological Not Present- Decreased Memory, Fainting, Headaches, Numbness, Seizures, Tingling, Tremor, Trouble walking and Weakness. Psychiatric  Not Present- Anxiety, Bipolar, Change  in Sleep Pattern, Depression, Fearful and Frequent crying. Endocrine Present- New Diabetes. Not Present- Cold Intolerance, Excessive Hunger, Hair Changes, Heat Intolerance and Hot flashes. Hematology Not Present- Easy Bruising, Excessive bleeding, Gland problems, HIV and Persistent Infections.  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 Daphine Deutscher, MD, FACS

## 2017-11-11 ENCOUNTER — Inpatient Hospital Stay (HOSPITAL_COMMUNITY): Payer: 59 | Admitting: Anesthesiology

## 2017-11-11 ENCOUNTER — Other Ambulatory Visit: Payer: Self-pay

## 2017-11-11 ENCOUNTER — Encounter (HOSPITAL_COMMUNITY): Payer: Self-pay | Admitting: *Deleted

## 2017-11-11 ENCOUNTER — Encounter (HOSPITAL_COMMUNITY)
Admission: RE | Disposition: A | Discharge status: Home / Self Care | Payer: Self-pay | Source: Ambulatory Visit | Attending: Surgery

## 2017-11-11 ENCOUNTER — Inpatient Hospital Stay (HOSPITAL_COMMUNITY)
Admission: RE | Admit: 2017-11-11 | Discharge: 2017-11-14 | DRG: 328 | Disposition: A | Discharge status: Home / Self Care | Payer: 59 | Source: Ambulatory Visit | Attending: Surgery | Admitting: Surgery

## 2017-11-11 DIAGNOSIS — E7800 Pure hypercholesterolemia, unspecified: Secondary | ICD-10-CM | POA: Diagnosis present

## 2017-11-11 DIAGNOSIS — K5900 Constipation, unspecified: Secondary | ICD-10-CM | POA: Diagnosis present

## 2017-11-11 DIAGNOSIS — K219 Gastro-esophageal reflux disease without esophagitis: Secondary | ICD-10-CM | POA: Diagnosis present

## 2017-11-11 DIAGNOSIS — Z79899 Other long term (current) drug therapy: Secondary | ICD-10-CM | POA: Diagnosis not present

## 2017-11-11 DIAGNOSIS — K220 Achalasia of cardia: Principal | ICD-10-CM | POA: Diagnosis present

## 2017-11-11 DIAGNOSIS — I10 Essential (primary) hypertension: Secondary | ICD-10-CM | POA: Diagnosis not present

## 2017-11-11 HISTORY — PX: HELLER MYOTOMY: SHX5259

## 2017-11-11 LAB — CREATININE, SERUM
CREATININE: 1.07 mg/dL (ref 0.61–1.24)
GFR calc Af Amer: >60 mL/min (ref >60)
GFR calc non Af Amer: >60 mL/min (ref >60)

## 2017-11-11 LAB — CBC
HEMATOCRIT: 47.2 % (ref 39.0–52.0)
HEMOGLOBIN: 16.3 g/dL (ref 13.0–17.0)
MCH: 30.9 pg (ref 26.0–34.0)
MCHC: 34.5 g/dL (ref 30.0–36.0)
MCV: 89.4 fL (ref 78.0–100.0)
PLATELETS: 118 10*3/uL — AB (ref 150–400)
RBC: 5.28 MIL/uL (ref 4.22–5.81)
RDW: 13 % (ref 11.5–15.5)
WBC: 8.4 10*3/uL (ref 4.0–10.5)

## 2017-11-11 SURGERY — ESOPHAGOMYOTOMY, LAPAROSCOPIC, HELLER
Anesthesia: General

## 2017-11-11 MED ORDER — FENTANYL CITRATE (PF) 100 MCG/2ML IJ SOLN
INTRAMUSCULAR | Status: AC
  Filled 2017-11-11: qty 2

## 2017-11-11 MED ORDER — PANTOPRAZOLE SODIUM 40 MG IV SOLR
40.0000 mg | Freq: Every day | INTRAVENOUS | Status: DC
  Administered 2017-11-11 – 2017-11-13 (×3): 40 mg via INTRAVENOUS
  Filled 2017-11-11 (×3): qty 40

## 2017-11-11 MED ORDER — PROMETHAZINE HCL 25 MG/ML IJ SOLN: 6.2500 mg | INTRAMUSCULAR | Status: DC | PRN

## 2017-11-11 MED ORDER — ACETAMINOPHEN 500 MG PO TABS
1000.0000 mg | ORAL_TABLET | ORAL | Status: AC
  Administered 2017-11-11: 1000 mg via ORAL
  Filled 2017-11-11: qty 2

## 2017-11-11 MED ORDER — LIDOCAINE 2% (20 MG/ML) 5 ML SYRINGE
INTRAMUSCULAR | Status: DC | PRN
  Administered 2017-11-11: 1.5 mg/kg/h via INTRAVENOUS

## 2017-11-11 MED ORDER — HEPARIN SODIUM (PORCINE) 5000 UNIT/ML IJ SOLN
5000.0000 [IU] | Freq: Once | INTRAMUSCULAR | Status: AC
  Administered 2017-11-11: 5000 [IU] via SUBCUTANEOUS
  Filled 2017-11-11: qty 1

## 2017-11-11 MED ORDER — FENTANYL CITRATE (PF) 100 MCG/2ML IJ SOLN
INTRAMUSCULAR | Status: DC | PRN
  Administered 2017-11-11 (×2): 50 ug via INTRAVENOUS

## 2017-11-11 MED ORDER — LIDOCAINE HCL 2 % IJ SOLN
INTRAMUSCULAR | Status: AC
  Filled 2017-11-11: qty 20

## 2017-11-11 MED ORDER — HYDROMORPHONE HCL 1 MG/ML IJ SOLN: 0.2500 mg | INTRAMUSCULAR | Status: DC | PRN

## 2017-11-11 MED ORDER — CEFOTETAN DISODIUM-DEXTROSE 2-2.08 GM-%(50ML) IV SOLR
2.0000 g | INTRAVENOUS | Status: AC
  Administered 2017-11-11: 2 g via INTRAVENOUS
  Filled 2017-11-11: qty 50

## 2017-11-11 MED ORDER — HYDROMORPHONE HCL 1 MG/ML IJ SOLN
0.5000 mg | INTRAMUSCULAR | Status: DC | PRN
  Administered 2017-11-11 – 2017-11-12 (×2): 0.5 mg via INTRAVENOUS
  Filled 2017-11-11 (×2): qty 0.5

## 2017-11-11 MED ORDER — CHLORHEXIDINE GLUCONATE CLOTH 2 % EX PADS: 6.0000 | MEDICATED_PAD | Freq: Once | CUTANEOUS | Status: DC

## 2017-11-11 MED ORDER — ONDANSETRON 4 MG PO TBDP: 4.0000 mg | ORAL_TABLET | Freq: Four times a day (QID) | ORAL | Status: DC | PRN

## 2017-11-11 MED ORDER — ROCURONIUM BROMIDE 10 MG/ML (PF) SYRINGE
PREFILLED_SYRINGE | INTRAVENOUS | Status: DC | PRN
  Administered 2017-11-11: 40 mg via INTRAVENOUS
  Administered 2017-11-11 (×2): 10 mg via INTRAVENOUS

## 2017-11-11 MED ORDER — BUPIVACAINE LIPOSOME 1.3 % IJ SUSP
20.0000 mL | Freq: Once | INTRAMUSCULAR | Status: AC
  Administered 2017-11-11: 15 mL
  Filled 2017-11-11: qty 20

## 2017-11-11 MED ORDER — DEXAMETHASONE SODIUM PHOSPHATE 10 MG/ML IJ SOLN
INTRAMUSCULAR | Status: AC
  Filled 2017-11-11: qty 1

## 2017-11-11 MED ORDER — PROPOFOL 10 MG/ML IV BOLUS
INTRAVENOUS | Status: AC
  Filled 2017-11-11: qty 20

## 2017-11-11 MED ORDER — SUCCINYLCHOLINE CHLORIDE 200 MG/10ML IV SOSY
PREFILLED_SYRINGE | INTRAVENOUS | Status: AC
  Filled 2017-11-11: qty 10

## 2017-11-11 MED ORDER — KCL IN DEXTROSE-NACL 20-5-0.45 MEQ/L-%-% IV SOLN
INTRAVENOUS | Status: DC
  Administered 2017-11-11: 1000 mL via INTRAVENOUS
  Administered 2017-11-11 – 2017-11-13 (×4): via INTRAVENOUS
  Administered 2017-11-14: 100 mL/h via INTRAVENOUS
  Filled 2017-11-11 (×7): qty 1000

## 2017-11-11 MED ORDER — CELECOXIB 200 MG PO CAPS
200.0000 mg | ORAL_CAPSULE | ORAL | Status: AC
  Administered 2017-11-11: 200 mg via ORAL
  Filled 2017-11-11: qty 1

## 2017-11-11 MED ORDER — HYDRALAZINE HCL 20 MG/ML IJ SOLN
10.0000 mg | INTRAMUSCULAR | Status: DC | PRN
  Administered 2017-11-13 (×2): 10 mg via INTRAVENOUS
  Filled 2017-11-11 (×2): qty 1

## 2017-11-11 MED ORDER — ONDANSETRON HCL 4 MG/2ML IJ SOLN: 4.0000 mg | Freq: Four times a day (QID) | INTRAMUSCULAR | Status: DC | PRN

## 2017-11-11 MED ORDER — GABAPENTIN 300 MG PO CAPS
300.0000 mg | ORAL_CAPSULE | ORAL | Status: AC
  Administered 2017-11-11: 300 mg via ORAL
  Filled 2017-11-11: qty 1

## 2017-11-11 MED ORDER — DEXAMETHASONE SODIUM PHOSPHATE 10 MG/ML IJ SOLN
INTRAMUSCULAR | Status: DC | PRN
  Administered 2017-11-11: 10 mg via INTRAVENOUS

## 2017-11-11 MED ORDER — MIDAZOLAM HCL 2 MG/2ML IJ SOLN
INTRAMUSCULAR | Status: DC | PRN
Start: 2017-11-11 — End: 2017-11-11
  Administered 2017-11-11: 2 mg via INTRAVENOUS

## 2017-11-11 MED ORDER — ONDANSETRON HCL 4 MG/2ML IJ SOLN
INTRAMUSCULAR | Status: AC
  Filled 2017-11-11: qty 2

## 2017-11-11 MED ORDER — MIDAZOLAM HCL 2 MG/2ML IJ SOLN
INTRAMUSCULAR | Status: AC
  Filled 2017-11-11: qty 2

## 2017-11-11 MED ORDER — HEPARIN SODIUM (PORCINE) 5000 UNIT/ML IJ SOLN
5000.0000 [IU] | Freq: Three times a day (TID) | INTRAMUSCULAR | Status: DC
  Administered 2017-11-11 – 2017-11-14 (×8): 5000 [IU] via SUBCUTANEOUS
  Filled 2017-11-11 (×8): qty 1

## 2017-11-11 MED ORDER — SODIUM CHLORIDE 0.9 % IJ SOLN
INTRAMUSCULAR | Status: DC | PRN
Start: 2017-11-11 — End: 2017-11-11
  Administered 2017-11-11: 15 mL via INTRAVENOUS

## 2017-11-11 MED ORDER — SODIUM CHLORIDE 0.9 % IJ SOLN
INTRAMUSCULAR | Status: AC
  Filled 2017-11-11: qty 50

## 2017-11-11 MED ORDER — LACTATED RINGERS IV SOLN
INTRAVENOUS | Status: DC | PRN
  Administered 2017-11-11: 07:00:00 via INTRAVENOUS
  Administered 2017-11-11: 1000 mL

## 2017-11-11 MED ORDER — SUCCINYLCHOLINE CHLORIDE 200 MG/10ML IV SOSY
PREFILLED_SYRINGE | INTRAVENOUS | Status: DC | PRN
  Administered 2017-11-11: 100 mg via INTRAVENOUS

## 2017-11-11 MED ORDER — DEXTROSE 5 % IV SOLN
2.0000 g | Freq: Two times a day (BID) | INTRAVENOUS | Status: AC
  Administered 2017-11-11: 2 g via INTRAVENOUS
  Filled 2017-11-11: qty 2

## 2017-11-11 MED ORDER — SUGAMMADEX SODIUM 200 MG/2ML IV SOLN
INTRAVENOUS | Status: DC | PRN
  Administered 2017-11-11: 200 mg via INTRAVENOUS

## 2017-11-11 MED ORDER — ONDANSETRON HCL 4 MG/2ML IJ SOLN
INTRAMUSCULAR | Status: DC | PRN
  Administered 2017-11-11: 4 mg via INTRAVENOUS

## 2017-11-11 MED ORDER — INFLUENZA VAC SPLIT QUAD 0.5 ML IM SUSY: 0.5000 mL | PREFILLED_SYRINGE | INTRAMUSCULAR | Status: DC

## 2017-11-11 MED ORDER — SUGAMMADEX SODIUM 200 MG/2ML IV SOLN
INTRAVENOUS | Status: AC
  Filled 2017-11-11: qty 2

## 2017-11-11 MED ORDER — LIDOCAINE 2% (20 MG/ML) 5 ML SYRINGE
INTRAMUSCULAR | Status: DC | PRN
  Administered 2017-11-11: 100 mg via INTRAVENOUS

## 2017-11-11 MED ORDER — ROCURONIUM BROMIDE 10 MG/ML (PF) SYRINGE
PREFILLED_SYRINGE | INTRAVENOUS | Status: AC
  Filled 2017-11-11: qty 5

## 2017-11-11 MED ORDER — PROPOFOL 10 MG/ML IV BOLUS
INTRAVENOUS | Status: DC | PRN
  Administered 2017-11-11: 120 mg via INTRAVENOUS

## 2017-11-11 SURGICAL SUPPLY — 42 items
APPLIER CLIP 5 13 M/L LIGAMAX5 (MISCELLANEOUS)
CABLE HIGH FREQUENCY MONO STRZ (ELECTRODE) ×3 IMPLANT
CLIP APPLIE 5 13 M/L LIGAMAX5 (MISCELLANEOUS) IMPLANT
CLIP SUT LAPRA TY ABSORB (SUTURE) IMPLANT
COVER SURGICAL LIGHT HANDLE (MISCELLANEOUS) ×3 IMPLANT
DECANTER SPIKE VIAL GLASS SM (MISCELLANEOUS) ×3 IMPLANT
DERMABOND ADVANCED (GAUZE/BANDAGES/DRESSINGS) ×2
DERMABOND ADVANCED .7 DNX12 (GAUZE/BANDAGES/DRESSINGS) ×1 IMPLANT
DEVICE SUTURE ENDOST 10MM (ENDOMECHANICALS) IMPLANT
DISSECTOR BLUNT TIP ENDO 5MM (MISCELLANEOUS) ×3 IMPLANT
DRAIN PENROSE 18X1/2 LTX STRL (DRAIN) ×3 IMPLANT
DRAPE LAPAROSCOPIC ABDOMINAL (DRAPES) ×3 IMPLANT
ELECT L-HOOK LAP 45CM DISP (ELECTROSURGICAL) ×3
ELECT PENCIL ROCKER SW 15FT (MISCELLANEOUS) ×3 IMPLANT
ELECT REM PT RETURN 15FT ADLT (MISCELLANEOUS) ×3 IMPLANT
ELECTRODE L-HOOK LAP 45CM DISP (ELECTROSURGICAL) ×1 IMPLANT
GLOVE BIOGEL M 8.0 STRL (GLOVE) ×3 IMPLANT
GLOVE BIOGEL PI IND STRL 6.5 (GLOVE) ×2 IMPLANT
GLOVE BIOGEL PI IND STRL 7.5 (GLOVE) ×4 IMPLANT
GLOVE BIOGEL PI INDICATOR 6.5 (GLOVE) ×4
GLOVE BIOGEL PI INDICATOR 7.5 (GLOVE) ×8
GOWN STRL REUS W/TWL XL LVL3 (GOWN DISPOSABLE) ×9 IMPLANT
IRRIG SUCT STRYKERFLOW 2 WTIP (MISCELLANEOUS) ×3
IRRIGATION SUCT STRKRFLW 2 WTP (MISCELLANEOUS) ×1 IMPLANT
KIT BASIN OR (CUSTOM PROCEDURE TRAY) ×3 IMPLANT
NEEDLE HYPO 22GX1.5 SAFETY (NEEDLE) ×3 IMPLANT
SCISSORS METZENBAUM CVD 44CM (INSTRUMENTS) ×3 IMPLANT
SLEEVE XCEL OPT CAN 5 100 (ENDOMECHANICALS) ×9 IMPLANT
SUT ETHIBOND 2 0 SH (SUTURE) ×2
SUT ETHIBOND 2 0 SH 36X2 (SUTURE) ×1 IMPLANT
SUT MNCRL AB 4-0 PS2 18 (SUTURE) ×6 IMPLANT
SUT SURGIDAC NAB ES-9 0 48 120 (SUTURE) IMPLANT
SUT VIC AB 4-0 SH 18 (SUTURE) ×3 IMPLANT
SYR 20CC LL (SYRINGE) ×3 IMPLANT
TIP INNERVISION DETACH 56FR (MISCELLANEOUS) ×3 IMPLANT
TOWEL OR 17X26 10 PK STRL BLUE (TOWEL DISPOSABLE) ×6 IMPLANT
TOWEL OR NON WOVEN STRL DISP B (DISPOSABLE) ×3 IMPLANT
TRAY FOLEY W/METER SILVER 16FR (SET/KITS/TRAYS/PACK) ×3 IMPLANT
TRAY LAPAROSCOPIC (CUSTOM PROCEDURE TRAY) ×3 IMPLANT
TROCAR BLADELESS OPT 5 100 (ENDOMECHANICALS) ×3 IMPLANT
TROCAR XCEL NON-BLD 11X100MML (ENDOMECHANICALS) ×3 IMPLANT
TUBING INSUF HEATED (TUBING) ×3 IMPLANT

## 2017-11-11 NOTE — Anesthesia Procedure Notes (Signed)
Procedure Name: Intubation Date/Time: 11/11/2017 7:49 AM Performed by: Dione Booze, CRNA Pre-anesthesia Checklist: Suction available, Patient being monitored, Emergency Drugs available and Patient identified Patient Re-evaluated:Patient Re-evaluated prior to induction Preoxygenation: Pre-oxygenation with 100% oxygen Induction Type: IV induction, Rapid sequence and Cricoid Pressure applied Laryngoscope Size: Mac and 4 Grade View: Grade I Tube type: Oral Tube size: 7.5 mm Number of attempts: 1 Airway Equipment and Method: Stylet Placement Confirmation: ETT inserted through vocal cords under direct vision,  positive ETCO2 and breath sounds checked- equal and bilateral (cords clear.) Secured at: 22 cm Tube secured with: Tape Dental Injury: Teeth and Oropharynx as per pre-operative assessment

## 2017-11-11 NOTE — Anesthesia Preprocedure Evaluation (Addendum)
Anesthesia Evaluation  Patient identified by MRN, date of birth, ID band Patient awake    Reviewed: Allergy & Precautions, NPO status , Patient's Chart, lab work & pertinent test results  Airway Mallampati: II  TM Distance: >3 FB Neck ROM: Full    Dental no notable dental hx.    Pulmonary neg pulmonary ROS,    Pulmonary exam normal breath sounds clear to auscultation       Cardiovascular hypertension, Pt. on medications Normal cardiovascular exam Rhythm:Regular Rate:Normal     Neuro/Psych negative neurological ROS  negative psych ROS   GI/Hepatic Neg liver ROS, GERD  ,  Endo/Other  negative endocrine ROS  Renal/GU negative Renal ROS  negative genitourinary   Musculoskeletal negative musculoskeletal ROS (+)   Abdominal   Peds negative pediatric ROS (+)  Hematology negative hematology ROS (+)   Anesthesia Other Findings   Reproductive/Obstetrics negative OB ROS                             Anesthesia Physical Anesthesia Plan  ASA: II  Anesthesia Plan: General   Post-op Pain Management:    Induction: Intravenous and Rapid sequence  PONV Risk Score and Plan: 2 and Ondansetron and Dexamethasone  Airway Management Planned: Oral ETT  Additional Equipment:   Intra-op Plan:   Post-operative Plan: Extubation in OR  Informed Consent: I have reviewed the patients History and Physical, chart, labs and discussed the procedure including the risks, benefits and alternatives for the proposed anesthesia with the patient or authorized representative who has indicated his/her understanding and acceptance.   Dental advisory given  Plan Discussed with: CRNA and Surgeon  Anesthesia Plan Comments:        Anesthesia Quick Evaluation

## 2017-11-11 NOTE — Transfer of Care (Signed)
Immediate Anesthesia Transfer of Care Note  Patient: Andrew Andrade  Procedure(s) Performed: LAPAROSCOPIC HELLER MYOTOMY (N/A )  Patient Location: PACU  Anesthesia Type:General  Level of Consciousness: awake and patient cooperative  Airway & Oxygen Therapy: Patient Spontanous Breathing and Patient connected to face mask oxygen  Post-op Assessment: Report given to RN and Post -op Vital signs reviewed and stable  Post vital signs: Reviewed and stable  Last Vitals:  Vitals:   11/11/17 0615  BP: (!) 148/95  Pulse: 73  Resp: 18  Temp: 36.9 C  SpO2: 100%    Last Pain:  Vitals:   11/11/17 0615  TempSrc: Oral      Patients Stated Pain Goal: 4 (11/11/17 0641)  Complications: No apparent anesthesia complications

## 2017-11-11 NOTE — Interval H&P Note (Signed)
History and Physical Interval Note:  11/11/2017 7:35 AM  Andrew Andrade  has presented today for surgery, with the diagnosis of ACHALASIA  The various methods of treatment have been discussed with the patient and family. After consideration of risks, benefits and other options for treatment, the patient has consented to  Procedure(s): LAPAROSCOPIC HELLER MYOTOMY (N/A) as a surgical intervention .  The patient's history has been reviewed, patient examined, no change in status, stable for surgery.  I have reviewed the patient's chart and labs.  I indicated that he may have some degree of acid reflux after this operation.  Questions by him and his wife were answered to the patient's satisfaction.     Valarie MerinoMatthew B Aizley Stenseth

## 2017-11-11 NOTE — Op Note (Signed)
Name:  Andrew Andrade MRN: [id number] Date of Surgery: 11/11/2017  Preop Diagnosis:  Achalasia  Postop Diagnosis:  Achalasia, Post laparoscopic Heller myotomy  Procedure:  Upper endoscopy  (Intraoperative)  Surgeon:  Ovidio Kinavid Leigh Kaeding, M.D.  Anesthesia:  GET  Indications for procedure: Andrew Andrade is a 52 y.o. male whose primary care physician is Osei-Bonsu, Greggory StallionGeorge, MD and has had a laparoscopic Heller myotomy by Dr. Daphine DeutscherMartin.    Operative Note: The patient is under general anesthesia.  Dr. Daphine DeutscherMartin is laparoscoping the patient while I do an upper endoscopy to evaluate the Heller myotomy.  With the patient intubated, I passed the Pentax upper endoscope without difficulty down the esophagus.  The esophagus was unremarkable.  The esophago-gastric junction was at 41 cm.    The endoscope easily passed through the distal esophagus and into the stomach.  There was no mucosal injury or evidence of perforation.  The myotomy appeared to have opened the esophagogastric junction.  The scope was then withdrawn.  The esophagus was unremarkable and the patient tolerated the endoscopy without difficulty.  Ovidio Kinavid Margit Batte, MD, [facility] Pager: [phone] Office phone:  [phone]

## 2017-11-11 NOTE — Op Note (Signed)
Surgeon: Wenda LowMatt Genesee Nase, MD, FACS  Asst:  Ovidio Kinavid Newman, MD, FACS  Anes:  general  Preop Dx: achalasia Postop Dx: same  Procedure: Laparoscopic modified Heller myotomy with upper endoscopy by Dr. Ezzard StandingNewman Location [facility] Complications: None noted  EBL:   5 cc  Drains: none  Description of Procedure:  The patient was taken to OR 1 .  After anesthesia was administered and the patient was prepped a timeout was performed.  Access to the abdomen was achieved with a 5 mm Optiview through the left upper quadrant.  Standard trocar placement included 6 holes with 111 trochars placed to the right of the umbilicus and an upper midline 5 for the Nathanson retractor to retract the liver.  Forgot dissection ensued with the harmonic scalpel taken down the phrenoesophageal ligament all the way across mobilize and posterior identify both crura and posterior vagus.  A Penrose drain was placed around the stomach and now retraction was provided.  The hook electrocautery was used to dissected down between the longitudinal fibers to the circular fibers of the esophagus.  These were teased away from the underlying mucosa and divided with electrocautery when needed on low.  This was carried up for a distance of approximately 6 cm above the EG junction and then down onto the stomach but not all the way through the crown of circular fibers.  When this was completed Dr. Ezzard StandingNewman endoscope the patient.  We could see the mucosal defect and the EG junction and the scope easily entered into the stomach.  Since we did not divide all of the circular fibers of the crown I did not perform any form of fundoplication.  A single suture approximated the crura posteriorly with a 0 Surgidac placed with the Endo Stitch in the secured with a Ty knot.  The port sites were injected with Exparel diluted to 30 cc.    The patient tolerated the procedure well and was taken to the PACU in stable condition.     Matt B. Daphine DeutscherMartin, MD,  [facility], [place] [phone]

## 2017-11-11 NOTE — Anesthesia Postprocedure Evaluation (Signed)
Anesthesia Post Note  Patient: Andrew Andrade  Procedure(s) Performed: LAPAROSCOPIC HELLER MYOTOMY (N/A )     Patient location during evaluation: PACU Anesthesia Type: General Level of consciousness: awake and alert Pain management: pain level controlled Vital Signs Assessment: post-procedure vital signs reviewed and stable Respiratory status: spontaneous breathing, nonlabored ventilation, respiratory function stable and patient connected to nasal cannula oxygen Cardiovascular status: blood pressure returned to baseline and stable Postop Assessment: no apparent nausea or vomiting Anesthetic complications: no    Last Vitals:  Vitals:   11/11/17 0958 11/11/17 1000  BP: (!) 154/88 (!) 150/102  Pulse: 81 74  Resp: 20 (!) 21  Temp: 36.8 C   SpO2: 100% 100%    Last Pain:  Vitals:   11/11/17 0958  TempSrc:   PainSc: 0-No pain                 Saydie Gerdts S

## 2017-11-12 LAB — BASIC METABOLIC PANEL
Anion gap: 8 (ref 5–15)
BUN: 15 mg/dL (ref 6–20)
CHLORIDE: 101 mmol/L (ref 101–111)
CO2: 28 mmol/L (ref 22–32)
CREATININE: 1.16 mg/dL (ref 0.61–1.24)
Calcium: 9.3 mg/dL (ref 8.9–10.3)
Glucose, Bld: 130 mg/dL — ABNORMAL HIGH (ref 65–99)
POTASSIUM: 3.8 mmol/L (ref 3.5–5.1)
SODIUM: 137 mmol/L (ref 135–145)

## 2017-11-12 LAB — CBC
HCT: 42.2 % (ref 39.0–52.0)
HEMOGLOBIN: 14.4 g/dL (ref 13.0–17.0)
MCH: 30.6 pg (ref 26.0–34.0)
MCHC: 34.1 g/dL (ref 30.0–36.0)
MCV: 89.6 fL (ref 78.0–100.0)
Platelets: 135 10*3/uL — ABNORMAL LOW (ref 150–400)
RBC: 4.71 MIL/uL (ref 4.22–5.81)
RDW: 12.9 % (ref 11.5–15.5)
WBC: 7 10*3/uL (ref 4.0–10.5)

## 2017-11-12 NOTE — Progress Notes (Signed)
1 Day Post-Op   Subjective/Chief Complaint: Having some gas pain.  No flatus or BM yet.  No n/v.     Objective: Vital signs in last 24 hours: Temp:  [98.4 F (36.9 C)-99.5 F (37.5 C)] 98.7 F (37.1 C) (02/02 1000) Pulse Rate:  [63-88] 66 (02/02 1000) Resp:  [16-18] 16 (02/02 1000) BP: (135-172)/(81-100) 172/94 (02/02 1000) SpO2:  [96 %-100 %] 96 % (02/02 1000) Last BM Date: 11/10/17  Intake/Output from previous day: 02/01 0701 - 02/02 0700 In: 2943.3 [I.V.:2893.3; IV Piggyback:50] Out: 1335 [Urine:1260; Blood:75] Intake/Output this shift: Total I/O In: 355 [I.V.:355] Out: 800 [Urine:800]  General appearance: alert, cooperative and no distress Resp: breathing comfortably GI: soft, mildly distended, approp tender.  dressings c/d/i.   Extremities: extremities normal, atraumatic, no cyanosis or edema  Lab Results:  Recent Labs    11/11/17 1145 11/12/17 0536  WBC 8.4 7.0  HGB 16.3 14.4  HCT 47.2 42.2  PLT 118* 135*   BMET Recent Labs    11/11/17 1145 11/12/17 0536  NA  --  137  K  --  3.8  CL  --  101  CO2  --  28  GLUCOSE  --  130*  BUN  --  15  CREATININE 1.07 1.16  CALCIUM  --  9.3   PT/INR No results for input(s): LABPROT, INR in the last 72 hours. ABG No results for input(s): PHART, HCO3 in the last 72 hours.  Invalid input(s): PCO2, PO2  Studies/Results: No results found.  Anti-infectives: Anti-infectives (From admission, onward)   Start     Dose/Rate Route Frequency Ordered Stop   11/11/17 2000  cefoTEtan (CEFOTAN) 2 g in dextrose 5 % 50 mL IVPB     2 g 100 mL/hr over 30 Minutes Intravenous Every 12 hours 11/11/17 1118 11/11/17 2225   11/11/17 0624  cefoTEtan in Dextrose 5% (CEFOTAN) IVPB 2 g     2 g Intravenous On call to O.R. 11/11/17 [id number] 11/11/17 0816      Assessment/Plan: s/p Procedure(s): LAPAROSCOPIC HELLER MYOTOMY (N/A) d/c foley  Clears today. Fulls tomorrow   LOS: 1 day    Andrew Andrade 11/12/2017

## 2017-11-13 MED ORDER — POLYETHYLENE GLYCOL 3350 17 G PO PACK
17.0000 g | PACK | Freq: Every day | ORAL | Status: DC
  Administered 2017-11-13: 17 g via ORAL
  Filled 2017-11-13: qty 1

## 2017-11-13 NOTE — Progress Notes (Signed)
2 Days Post-Op   Subjective/Chief Complaint: Comfortable No flatus or BM yet - feels distended Tolerated clear liquids well yesterday   Objective: Vital signs in last 24 hours: Temp:  [98.2 F (36.8 C)-98.7 F (37.1 C)] 98.2 F (36.8 C) (02/03 0515) Pulse Rate:  [65-75] 66 (02/03 0515) Resp:  [16] 16 (02/03 0515) BP: (126-185)/(83-103) 126/83 (02/03 0515) SpO2:  [96 %-100 %] 100 % (02/03 0515) Last BM Date: 11/10/17  Intake/Output from previous day: 02/02 0701 - 02/03 0700 In: 2744 [P.O.:389; I.V.:2355] Out: 1600 [Urine:1600] Intake/Output this shift: No intake/output data recorded.  General appearance: alert, cooperative and no distress Resp: clear to auscultation bilaterally GI: soft, mildly distended; + BS; incisional tenderness Incisions c/d/i  Lab Results:  Recent Labs    11/11/17 1145 11/12/17 0536  WBC 8.4 7.0  HGB 16.3 14.4  HCT 47.2 42.2  PLT 118* 135*   BMET Recent Labs    11/11/17 1145 11/12/17 0536  NA  --  137  K  --  3.8  CL  --  101  CO2  --  28  GLUCOSE  --  130*  BUN  --  15  CREATININE 1.07 1.16  CALCIUM  --  9.3   PT/INR No results for input(s): LABPROT, INR in the last 72 hours. ABG No results for input(s): PHART, HCO3 in the last 72 hours.  Invalid input(s): PCO2, PO2  Studies/Results: No results found.  Anti-infectives: Anti-infectives (From admission, onward)   Start     Dose/Rate Route Frequency Ordered Stop   11/11/17 2000  cefoTEtan (CEFOTAN) 2 g in dextrose 5 % 50 mL IVPB     2 g 100 mL/hr over 30 Minutes Intravenous Every 12 hours 11/11/17 1118 11/11/17 2225   11/11/17 0624  cefoTEtan in Dextrose 5% (CEFOTAN) IVPB 2 g     2 g Intravenous On call to O.R. 11/11/17 [id number] 11/11/17 0816      Assessment/Plan: s/p Procedure(s): LAPAROSCOPIC HELLER MYOTOMY (N/A) Full liquids today Miralax for constipation Encourage ambulation Possible discharge soon.    LOS: 2 days    Andrew Andrade 11/13/2017

## 2017-11-14 MED ORDER — HYDROCODONE-ACETAMINOPHEN 5-325 MG PO TABS: 1.0000 | ORAL_TABLET | Freq: Four times a day (QID) | ORAL | 0 refills | Status: DC | PRN

## 2017-11-14 NOTE — Discharge Summary (Signed)
Physician Discharge Summary  Patient ID: Andrew CableDaniel A Andrade MRN: [id number] DOB/AGE: 53/11/1965 53 y.o.  Admit date: 11/11/2017 Discharge date: 11/14/2017  Admission Diagnoses:  Achalasia   Discharge Diagnoses:  same  Principal Problem:   Achalasia   Surgery:  Lap modified Heller myotomy  Discharged Condition: Improved  Hospital Course:   Had surgery on Friday;  Followed over the weekend on Sat and Sunday and ready for discharge on Monday.  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    Consults: none   Significant Diagnostic Studies: none    Discharge Exam: Blood pressure 139/77, pulse 68, temperature 98.6 F (37 C), temperature source Oral, resp. rate 18, height 5\' 4"  (1.626 m), weight 71.2 kg (157 lb), SpO2 99 %. Incisions OK  Disposition: 01-Home or Self Care  Discharge Instructions    Call MD for:  persistant nausea and vomiting   Complete by:  As directed    Call MD for:  severe uncontrolled pain   Complete by:  As directed    Diet - low sodium heart healthy   Complete by:  As directed    Chew, chew, chew your food! Stand and deep breathe when necessary to help food enter your stomach.  Use Miralax or Dulcolax suppositories if needed for constipation.   Increase activity slowly   Complete by:  As directed      Allergies as of 11/14/2017   No Known Allergies     Medication List    TAKE these medications   amLODipine 10 MG tablet Commonly known as:  NORVASC Take 10 mg by mouth daily.   atorvastatin 40 MG tablet Commonly known as:  LIPITOR Take 40 mg by mouth daily.   cholecalciferol 1000 units tablet Commonly known as:  VITAMIN D Take 1,000 Units by mouth daily.   HYDROcodone-acetaminophen 5-325 MG tablet Commonly known as:  NORCO/VICODIN Take 1 tablet by mouth every 6 (six) hours as needed for moderate pain.   ibuprofen 600 MG tablet Commonly known as:  ADVIL,MOTRIN Take 1 tablet (600 mg  total) by mouth every 6 (six) hours as needed.   olmesartan 40 MG tablet Commonly known as:  BENICAR Take 40 mg by mouth daily.   pantoprazole 40 MG tablet Commonly known as:  PROTONIX Take 1 tablet (40 mg total) by mouth 2 (two) times daily.      Follow-up Information    Luretha MurphyMartin, Akaya Proffit, MD. Schedule an appointment as soon as possible for a visit in 2 week(s).   Specialty:  General Surgery Contact information: [address] [place] [address] [phone]           Signed: Valarie MerinoMatthew B Marylen Zuk 11/14/2017, 10:10 AM

## 2017-12-30 DIAGNOSIS — E785 Hyperlipidemia, unspecified: Secondary | ICD-10-CM | POA: Diagnosis not present

## 2017-12-30 DIAGNOSIS — I10 Essential (primary) hypertension: Secondary | ICD-10-CM | POA: Diagnosis not present

## 2017-12-30 DIAGNOSIS — I119 Hypertensive heart disease without heart failure: Secondary | ICD-10-CM | POA: Diagnosis not present

## 2017-12-30 DIAGNOSIS — Z0000 Encounter for general adult medical examination without abnormal findings: Secondary | ICD-10-CM | POA: Diagnosis not present

## 2017-12-30 DIAGNOSIS — Z01118 Encounter for examination of ears and hearing with other abnormal findings: Secondary | ICD-10-CM | POA: Diagnosis not present

## 2018-03-10 DIAGNOSIS — I119 Hypertensive heart disease without heart failure: Secondary | ICD-10-CM | POA: Diagnosis not present

## 2018-03-10 DIAGNOSIS — I10 Essential (primary) hypertension: Secondary | ICD-10-CM | POA: Diagnosis not present

## 2018-03-10 DIAGNOSIS — E785 Hyperlipidemia, unspecified: Secondary | ICD-10-CM | POA: Diagnosis not present

## 2018-06-06 DIAGNOSIS — H11153 Pinguecula, bilateral: Secondary | ICD-10-CM | POA: Diagnosis not present

## 2018-06-06 DIAGNOSIS — H401131 Primary open-angle glaucoma, bilateral, mild stage: Secondary | ICD-10-CM | POA: Diagnosis not present

## 2018-06-06 DIAGNOSIS — H2513 Age-related nuclear cataract, bilateral: Secondary | ICD-10-CM | POA: Diagnosis not present

## 2018-06-27 DIAGNOSIS — K219 Gastro-esophageal reflux disease without esophagitis: Secondary | ICD-10-CM | POA: Diagnosis not present

## 2018-06-27 DIAGNOSIS — I119 Hypertensive heart disease without heart failure: Secondary | ICD-10-CM | POA: Diagnosis not present

## 2018-06-27 DIAGNOSIS — I10 Essential (primary) hypertension: Secondary | ICD-10-CM | POA: Diagnosis not present

## 2018-07-11 DIAGNOSIS — I119 Hypertensive heart disease without heart failure: Secondary | ICD-10-CM | POA: Diagnosis not present

## 2018-07-11 DIAGNOSIS — I10 Essential (primary) hypertension: Secondary | ICD-10-CM | POA: Diagnosis not present

## 2018-07-11 DIAGNOSIS — E7800 Pure hypercholesterolemia, unspecified: Secondary | ICD-10-CM | POA: Diagnosis not present

## 2018-07-21 DIAGNOSIS — E7800 Pure hypercholesterolemia, unspecified: Secondary | ICD-10-CM | POA: Diagnosis not present

## 2018-07-21 DIAGNOSIS — I119 Hypertensive heart disease without heart failure: Secondary | ICD-10-CM | POA: Diagnosis not present

## 2018-07-21 DIAGNOSIS — K219 Gastro-esophageal reflux disease without esophagitis: Secondary | ICD-10-CM | POA: Diagnosis not present

## 2018-08-15 DIAGNOSIS — H401131 Primary open-angle glaucoma, bilateral, mild stage: Secondary | ICD-10-CM | POA: Diagnosis not present

## 2018-09-15 DIAGNOSIS — R7303 Prediabetes: Secondary | ICD-10-CM | POA: Diagnosis not present

## 2018-11-10 DIAGNOSIS — H401131 Primary open-angle glaucoma, bilateral, mild stage: Secondary | ICD-10-CM | POA: Diagnosis not present

## 2018-11-10 DIAGNOSIS — H11153 Pinguecula, bilateral: Secondary | ICD-10-CM | POA: Diagnosis not present

## 2018-11-10 DIAGNOSIS — H2513 Age-related nuclear cataract, bilateral: Secondary | ICD-10-CM | POA: Diagnosis not present

## 2018-12-12 DIAGNOSIS — E7800 Pure hypercholesterolemia, unspecified: Secondary | ICD-10-CM | POA: Diagnosis not present

## 2018-12-12 DIAGNOSIS — I119 Hypertensive heart disease without heart failure: Secondary | ICD-10-CM | POA: Diagnosis not present

## 2018-12-12 DIAGNOSIS — I10 Essential (primary) hypertension: Secondary | ICD-10-CM | POA: Diagnosis not present

## 2019-01-25 ENCOUNTER — Ambulatory Visit: Payer: 59

## 2019-02-22 DIAGNOSIS — K219 Gastro-esophageal reflux disease without esophagitis: Secondary | ICD-10-CM | POA: Diagnosis not present

## 2019-02-22 DIAGNOSIS — E7800 Pure hypercholesterolemia, unspecified: Secondary | ICD-10-CM | POA: Diagnosis not present

## 2019-02-22 DIAGNOSIS — I119 Hypertensive heart disease without heart failure: Secondary | ICD-10-CM | POA: Diagnosis not present

## 2019-02-22 DIAGNOSIS — Z01021 Encounter for examination of eyes and vision following failed vision screening with abnormal findings: Secondary | ICD-10-CM | POA: Diagnosis not present

## 2019-02-22 DIAGNOSIS — Z0001 Encounter for general adult medical examination with abnormal findings: Secondary | ICD-10-CM | POA: Diagnosis not present

## 2019-02-22 DIAGNOSIS — Z136 Encounter for screening for cardiovascular disorders: Secondary | ICD-10-CM | POA: Diagnosis not present

## 2019-02-22 DIAGNOSIS — Z1329 Encounter for screening for other suspected endocrine disorder: Secondary | ICD-10-CM | POA: Diagnosis not present

## 2019-02-22 DIAGNOSIS — Z131 Encounter for screening for diabetes mellitus: Secondary | ICD-10-CM | POA: Diagnosis not present

## 2019-11-12 ENCOUNTER — Other Ambulatory Visit: Payer: 59

## 2019-11-14 ENCOUNTER — Ambulatory Visit: Payer: 59

## 2019-11-14 ENCOUNTER — Other Ambulatory Visit: Payer: 59

## 2020-01-15 ENCOUNTER — Ambulatory Visit: Payer: 59 | Attending: Internal Medicine

## 2020-05-23 ENCOUNTER — Encounter (HOSPITAL_COMMUNITY): Payer: Self-pay

## 2020-05-23 ENCOUNTER — Ambulatory Visit (HOSPITAL_COMMUNITY)
Admission: EM | Admit: 2020-05-23 | Discharge: 2020-05-23 | Disposition: A | Discharge status: Home / Self Care | Payer: 59 | Attending: Family Medicine | Admitting: Family Medicine

## 2020-05-23 ENCOUNTER — Other Ambulatory Visit: Payer: Self-pay

## 2020-05-23 DIAGNOSIS — B009 Herpesviral infection, unspecified: Secondary | ICD-10-CM | POA: Diagnosis not present

## 2020-05-23 MED ORDER — VALACYCLOVIR HCL 1 G PO TABS
1000.0000 mg | ORAL_TABLET | Freq: Two times a day (BID) | ORAL | 0 refills | Status: AC
Start: 2020-05-23 — End: 2020-05-30

## 2020-05-23 NOTE — Discharge Instructions (Addendum)
Treating you for herpes This is a virus Take the medicine as prescribed Follow up as needed for continued or worsening symptoms

## 2020-05-23 NOTE — ED Triage Notes (Signed)
Pt presents with mouth lesion he noticed when he woke up this morning.

## 2020-05-23 NOTE — ED Provider Notes (Signed)
[facility]    CSN: [id number] Arrival date & time: 05/23/20  1346      History   Chief Complaint Chief Complaint  Patient presents with  . Mouth Lesions    HPI Andrew Andrade is a 54 y.o. male.   Patient is a 54 year old male presents today with rash to right lower facial area.  Woke up with this this morning.  Describes as she is mildly itchy and irritating.  No history of similar.  Denies any fever, joint pain. Denies any recent changes in lotions, detergents, foods or other possible irritants. No recent travel. Nobody else at home has the rash. Patient has been outside but denies any contact with plants or insects. No new foods or medications.        Past Medical History:  Diagnosis Date  . GERD (gastroesophageal reflux disease)   . Hyperlipidemia   . Hypertension     Patient Active Problem List   Diagnosis Date Noted  . Achalasia 11/11/2017    Past Surgical History:  Procedure Laterality Date  . ESOPHAGEAL MANOMETRY N/A 09/10/2015   Procedure: ESOPHAGEAL MANOMETRY (EM);  Surgeon: Carman Ching, MD;  Location: [facility];  Service: Endoscopy;  Laterality: N/A;  . HELLER MYOTOMY N/A 11/11/2017   Procedure: LAPAROSCOPIC HELLER MYOTOMY;  Surgeon: Luretha Murphy, MD;  Location: [facility];  Service: General;  Laterality: N/A;  . UPPER GI ENDOSCOPY         Home Medications    Prior to Admission medications   Medication Sig Start Date End Date Taking? Authorizing Provider  amLODipine (NORVASC) 10 MG tablet Take 10 mg by mouth daily.     [provider]  atorvastatin (LIPITOR) 40 MG tablet Take 40 mg by mouth daily.  09/10/17   [provider]  cholecalciferol (VITAMIN D) 1000 units tablet Take 1,000 Units by mouth daily.    [provider]  HYDROcodone-acetaminophen (NORCO/VICODIN) 5-325 MG tablet Take 1 tablet by mouth every 6 (six) hours as needed for moderate pain. 11/14/17   Luretha Murphy, MD  ibuprofen (ADVIL,MOTRIN) 600  MG tablet Take 1 tablet (600 mg total) by mouth every 6 (six) hours as needed. 09/28/17   Georgetta Haber, NP  olmesartan (BENICAR) 40 MG tablet Take 40 mg by mouth daily.  09/16/17   [provider]  pantoprazole (PROTONIX) 40 MG tablet Take 1 tablet (40 mg total) by mouth 2 (two) times daily. 01/06/17   Deatra Canter, FNP  valACYclovir (VALTREX) 1000 MG tablet Take 1 tablet (1,000 mg total) by mouth 2 (two) times daily for 7 days. 05/23/20 05/30/20  Janace Aris, NP    Family History History reviewed. No pertinent family history.  Social History Social History   Tobacco Use  . Smoking status: Never Smoker  . Smokeless tobacco: Never Used  Vaping Use  . Vaping Use: Never used  Substance Use Topics  . Alcohol use: No  . Drug use: No     Allergies   Patient has no known allergies.   Review of Systems Review of Systems   Physical Exam Triage Vital Signs ED Triage Vitals  Enc Vitals Group     BP 05/23/20 1413 (!) 141/76     Pulse Rate 05/23/20 1413 79     Resp 05/23/20 1413 18     Temp 05/23/20 1413 98.5 F (36.9 C)     Temp Source 05/23/20 1413 Oral     SpO2 05/23/20 1413 99 %  Weight --      Height --      Head Circumference --      Peak Flow --      Pain Score 05/23/20 1412 3     Pain Loc --      Pain Edu? --      Excl. in GC? --    No data found.  Updated Vital Signs BP (!) 141/76 (BP Location: Right Arm)   Pulse 79   Temp 98.5 F (36.9 C) (Oral)   Resp 18   SpO2 99%   Visual Acuity Right Eye Distance:   Left Eye Distance:   Bilateral Distance:    Right Eye Near:   Left Eye Near:    Bilateral Near:     Physical Exam Vitals and nursing note reviewed.  Constitutional:      Appearance: Normal appearance.  HENT:     Head: Normocephalic and atraumatic.     Nose: Nose normal.  Eyes:     Conjunctiva/sclera: Conjunctivae normal.  Pulmonary:     Effort: Pulmonary effort is normal.  Musculoskeletal:        General: Normal range of  motion.     Cervical back: Normal range of motion.  Skin:    General: Skin is warm and dry.     Findings: Rash present.     Comments: See picture for detail   Neurological:     Mental Status: He is alert.  Psychiatric:        Mood and Affect: Mood normal.        UC Treatments / Results  Labs (all labs ordered are listed, but only abnormal results are displayed) Labs Reviewed - No data to display  EKG   Radiology No results found.  Procedures Procedures (including critical care time)  Medications Ordered in UC Medications - No data to display  Initial Impression / Assessment and Plan / UC Course  I have reviewed the triage vital signs and the nursing notes.  Pertinent labs & imaging results that were available during my care of the patient were reviewed by me and considered in my medical decision making (see chart for details).     HSV Treated with Valtrex Follow up as needed for continued or worsening symptoms  Final Clinical Impressions(s) / UC Diagnoses   Final diagnoses:  HSV infection     Discharge Instructions     Treating you for herpes This is a virus Take the medicine as prescribed Follow up as needed for continued or worsening symptoms     ED Prescriptions    Medication Sig Dispense Auth. Provider   valACYclovir (VALTREX) 1000 MG tablet Take 1 tablet (1,000 mg total) by mouth 2 (two) times daily for 7 days. 14 tablet Miko Sirico A, NP     PDMP not reviewed this encounter.   Janace Aris, NP 05/23/20 1454

## 2021-02-26 ENCOUNTER — Telehealth: Payer: Self-pay | Admitting: Physician Assistant

## 2021-02-26 NOTE — Telephone Encounter (Signed)
Moved 5/20 appt to earlier time per provider request. Called and spoke with patient. Confirmed new time

## 2021-02-27 ENCOUNTER — Other Ambulatory Visit: Payer: Self-pay

## 2021-02-27 ENCOUNTER — Other Ambulatory Visit: Payer: 59

## 2021-02-27 ENCOUNTER — Encounter: Payer: 59 | Admitting: Physician Assistant

## 2021-02-27 ENCOUNTER — Inpatient Hospital Stay: Payer: 59

## 2021-02-27 ENCOUNTER — Inpatient Hospital Stay: Payer: 59 | Attending: Physician Assistant | Admitting: Physician Assistant

## 2021-02-27 ENCOUNTER — Encounter: Payer: Self-pay | Admitting: Physician Assistant

## 2021-02-27 VITALS — BP 160/85 | HR 72 | Temp 97.1°F | Resp 18 | Ht 64.0 in | Wt 157.5 lb

## 2021-02-27 DIAGNOSIS — D709 Neutropenia, unspecified: Secondary | ICD-10-CM | POA: Diagnosis present

## 2021-02-27 DIAGNOSIS — E785 Hyperlipidemia, unspecified: Secondary | ICD-10-CM | POA: Diagnosis not present

## 2021-02-27 DIAGNOSIS — D708 Other neutropenia: Secondary | ICD-10-CM

## 2021-02-27 DIAGNOSIS — I10 Essential (primary) hypertension: Secondary | ICD-10-CM | POA: Insufficient documentation

## 2021-02-27 DIAGNOSIS — K219 Gastro-esophageal reflux disease without esophagitis: Secondary | ICD-10-CM | POA: Diagnosis not present

## 2021-02-27 LAB — CBC WITH DIFFERENTIAL (CANCER CENTER ONLY)
Abs Immature Granulocytes: 0.01 10*3/uL (ref 0.00–0.07)
Basophils Absolute: 0 10*3/uL (ref 0.0–0.1)
Basophils Relative: 1 %
Eosinophils Absolute: 0 10*3/uL (ref 0.0–0.5)
Eosinophils Relative: 1 %
HCT: 43.2 % (ref 39.0–52.0)
Hemoglobin: 14.9 g/dL (ref 13.0–17.0)
Immature Granulocytes: 0 %
Lymphocytes Relative: 40 %
Lymphs Abs: 0.9 10*3/uL (ref 0.7–4.0)
MCH: 30.7 pg (ref 26.0–34.0)
MCHC: 34.5 g/dL (ref 30.0–36.0)
MCV: 89.1 fL (ref 80.0–100.0)
Monocytes Absolute: 0.2 10*3/uL (ref 0.1–1.0)
Monocytes Relative: 9 %
Neutro Abs: 1.1 10*3/uL — ABNORMAL LOW (ref 1.7–7.7)
Neutrophils Relative %: 49 %
Platelet Count: 166 10*3/uL (ref 150–400)
RBC: 4.85 MIL/uL (ref 4.22–5.81)
RDW: 12.6 % (ref 11.5–15.5)
WBC Count: 2.3 10*3/uL — ABNORMAL LOW (ref 4.0–10.5)
nRBC: 0 % (ref 0.0–0.2)

## 2021-02-27 LAB — CMP (CANCER CENTER ONLY)
ALT: 9 U/L (ref 0–44)
AST: 19 U/L (ref 15–41)
Albumin: 4 g/dL (ref 3.5–5.0)
Alkaline Phosphatase: 68 U/L (ref 38–126)
Anion gap: 10 (ref 5–15)
BUN: 15 mg/dL (ref 6–20)
CO2: 29 mmol/L (ref 22–32)
Calcium: 9.8 mg/dL (ref 8.9–10.3)
Chloride: 104 mmol/L (ref 98–111)
Creatinine: 1.16 mg/dL (ref 0.61–1.24)
GFR, Estimated: >60 mL/min (ref >60)
Glucose, Bld: 92 mg/dL (ref 70–99)
Potassium: 3.4 mmol/L — ABNORMAL LOW (ref 3.5–5.1)
Sodium: 143 mmol/L (ref 135–145)
Total Bilirubin: 1.1 mg/dL (ref 0.3–1.2)
Total Protein: 7.6 g/dL (ref 6.5–8.1)

## 2021-02-27 LAB — SEDIMENTATION RATE: Sed Rate: 12 mm/hr (ref 0–16)

## 2021-02-27 LAB — VITAMIN B12: Vitamin B-12: 1042 pg/mL — ABNORMAL HIGH (ref 180–914)

## 2021-02-27 LAB — C-REACTIVE PROTEIN: CRP: <0.5 mg/dL (ref <1.0)

## 2021-02-27 LAB — HIV ANTIBODY (ROUTINE TESTING W REFLEX): HIV Screen 4th Generation wRfx: NONREACTIVE

## 2021-02-27 LAB — HEPATITIS B SURFACE ANTIGEN: Hepatitis B Surface Ag: NONREACTIVE

## 2021-02-27 LAB — SAVE SMEAR(SSMR), FOR PROVIDER SLIDE REVIEW

## 2021-02-27 LAB — HEPATITIS B CORE ANTIBODY, TOTAL: Hep B Core Total Ab: NONREACTIVE

## 2021-02-27 LAB — HEPATITIS C ANTIBODY: HCV Ab: NONREACTIVE

## 2021-02-27 LAB — HEPATITIS B SURFACE ANTIBODY,QUALITATIVE: Hep B S Ab: REACTIVE — AB

## 2021-02-27 LAB — FOLATE: Folate: 8.1 ng/mL (ref >5.9)

## 2021-02-27 NOTE — Progress Notes (Signed)
[place] [phone]   Fax:(336) [place] NOTE  Patient Care Team: Benito Mccreedy, MD as PCP - General (Internal Medicine)  Hematological/Oncological History 1) 02/20/2021: Labs from Dr. Iona Beard Osei-Bonsu from [facility] -WBC 2.6 (L), Hgb 15.3, Plt 200, ANC 1097 (L).   2) 02/27/2021: Establish care with Dede Query PA-C  CHIEF COMPLAINTS/PURPOSE OF CONSULTATION:  "Neutropenia "  HISTORY OF PRESENTING ILLNESS:  Andrew Andrade 55 y.o. male with medical history significant for hypertension, hyperlipidemia, achalasia and GERD. Patient is unaccompanied for this visit.   On exam today, Andrew Andrade reports no changes to his energy or appetite levels. He is staying active and is able to complete all his ADLs on his own. He denies any changes to his diet or weight. Patient denies any nausea, vomiting or abdominal pain. His bowel movements are regular without any constipation or diarrhea. He denies easy bruising or signs of bleeding. He denies any fevers, chills, night sweats, shortness of breath, chest pain, cough, edema, neuropathy or skin changes. He has no other complaints. Rest of the 10 point ROS is below.   MEDICAL HISTORY:  Past Medical History:  Diagnosis Date  . GERD (gastroesophageal reflux disease)   . Hyperlipidemia   . Hypertension     SURGICAL HISTORY: Past Surgical History:  Procedure Laterality Date  . ESOPHAGEAL MANOMETRY N/A 09/10/2015   Procedure: ESOPHAGEAL MANOMETRY (EM);  Surgeon: Laurence Spates, MD;  Location: [facility];  Service: Endoscopy;  Laterality: N/A;  . HELLER MYOTOMY N/A 11/11/2017   Procedure: LAPAROSCOPIC HELLER MYOTOMY;  Surgeon: Johnathan Hausen, MD;  Location: [facility];  Service: General;  Laterality: N/A;  . UPPER GI ENDOSCOPY      SOCIAL HISTORY: Social History   Socioeconomic History  . Marital status: Married    Spouse name: Not on file  . Number of children: Not on file  .  Years of education: Not on file  . Highest education level: Not on file  Occupational History  . Not on file  Tobacco Use  . Smoking status: Never Smoker  . Smokeless tobacco: Never Used  Vaping Use  . Vaping Use: Never used  Substance and Sexual Activity  . Alcohol use: No  . Drug use: No  . Sexual activity: Yes  Other Topics Concern  . Not on file  Social History Narrative   Marital status: married. From [place]; moved to [place] 25 years ago.      Children:  4 daughters; 1 grandchild.      Lives: with wife, youngest daughter.      Employment: works for city of [employer] x 18 years.   Social Determinants of Health   Financial Resource Strain: Not on file  Food Insecurity: Not on file  Transportation Needs: Not on file  Physical Activity: Not on file  Stress: Not on file  Social Connections: Not on file  Intimate Partner Violence: Not on file    FAMILY HISTORY: History reviewed. No pertinent family history.  ALLERGIES:  has No Known Allergies.  MEDICATIONS:  Current Outpatient Medications  Medication Sig Dispense Refill  . cholecalciferol (VITAMIN D) 1000 units tablet Take 1,000 Units by mouth daily.    Marland Kitchen amLODipine (NORVASC) 10 MG tablet Take 10 mg by mouth daily.     Marland Kitchen olmesartan (BENICAR) 40 MG tablet Take 40 mg by mouth daily.   1   No current facility-administered medications for this visit.    REVIEW OF SYSTEMS:  Constitutional: ( - ) fevers, ( - )  chills , ( - ) night sweats Eyes: ( - ) blurriness of vision, ( - ) double vision, ( - ) watery eyes Ears, nose, mouth, throat, and face: ( - ) mucositis, ( - ) sore throat Respiratory: ( - ) cough, ( - ) dyspnea, ( - ) wheezes Cardiovascular: ( - ) palpitation, ( - ) chest discomfort, ( - ) lower extremity swelling Gastrointestinal:  ( - ) nausea, ( - ) heartburn, ( - ) change in bowel habits Skin: ( - ) abnormal skin rashes Lymphatics: ( - ) new lymphadenopathy, ( - ) easy bruising Neurological: ( - )  numbness, ( - ) tingling, ( - ) new weaknesses Behavioral/Psych: ( - ) mood change, ( - ) new changes  All other systems were reviewed with the patient and are negative.  PHYSICAL EXAMINATION: ECOG PERFORMANCE STATUS: 0 - Asymptomatic  Vitals:   02/27/21 0933  BP: (!) 160/85  Pulse: 72  Resp: 18  Temp: (!) 97.1 F (36.2 C)  SpO2: 100%   Filed Weights   02/27/21 0933  Weight: 157 lb 8 oz (71.4 kg)    GENERAL: well appearing male in NAD  SKIN: skin color, texture, turgor are normal, no rashes or significant lesions EYES: conjunctiva are pink and non-injected, sclera clear OROPHARYNX: no exudate, no erythema; lips, buccal mucosa, and tongue normal  NECK: supple, non-tender LYMPH:  no palpable lymphadenopathy in the cervical, axillary or supraclavicular lymph nodes.  LUNGS: clear to auscultation and percussion with normal breathing effort HEART: regular rate & rhythm and no murmurs and no lower extremity edema ABDOMEN: soft, non-tender, non-distended, normal bowel sounds Musculoskeletal: no cyanosis of digits and no clubbing  PSYCH: alert & oriented x 3, fluent speech NEURO: no focal motor/sensory deficits  LABORATORY DATA:  I have reviewed the data as listed CBC Latest Ref Rng & Units 02/27/2021 11/12/2017 11/11/2017  WBC 4.0 - 10.5 K/uL 2.3(L) 7.0 8.4  Hemoglobin 13.0 - 17.0 g/dL 14.9 14.4 16.3  Hematocrit 39.0 - 52.0 % 43.2 42.2 47.2  Platelets 150 - 400 K/uL 166 135(L) 118(L)    CMP Latest Ref Rng & Units 11/12/2017 11/11/2017 11/04/2017  Glucose 65 - 99 mg/dL 130(H) - 90  BUN 6 - 20 mg/dL 15 - 13  Creatinine 0.61 - 1.24 mg/dL 1.16 1.07 1.08  Sodium 135 - 145 mmol/L 137 - 139  Potassium 3.5 - 5.1 mmol/L 3.8 - 3.7  Chloride 101 - 111 mmol/L 101 - 104  CO2 22 - 32 mmol/L 28 - 29  Calcium 8.9 - 10.3 mg/dL 9.3 - 9.4  Total Protein 6.0 - 8.3 g/dL - - -  Total Bilirubin 0.3 - 1.2 mg/dL - - -  Alkaline Phos 39 - 117 U/L - - -  AST 0 - 37 U/L - - -  ALT 0 - 53 U/L - - -     ASSESSMENT & PLAN Andrew Andrade is a 55 y.o. male presenting to the clinic for evaluation for neutropenia.   The differential for neutropenia includes benign ethnic neutropenia, infectious etiology, nutritional etiology, inflammatory etiology, or medication.  The patient is not currently taking any medications know to cause neutropenia.  We will order viral studies with hep B, hep C, and HIV as well.  In addition, we will check for nutritional anemias with B12 and folate levels.   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  This is a common condition  whereby individuals of African or Mediterranean descent tend to have slightly lower white blood cell counts in the general population.  Hallmark of the syndrome is an [place] between 1000 and 1500 which is patient falls into.  This is of little to no clinical consequence.  Unfortunately, this is a diagnosis of exclusion and therefore we will do further work-up in order to assure there is no other etiology.  Assuming none of the studies show any concerning abnormalities we will see the patient back in 6 months time in order to continue to monitor.   # Leukopenia/Neutropenia --Rule out infectious etiology with hepatitis B serologies, hepatitis C serologies, and HIV. --Nutritional evaluation with vitamin B12 and folate. --Repeat CBC and CMP.  Additionally we will order ESR and a peripheral blood film. --Assuming there are no concerning findings on her blood work-up today we will plan to see the patient back in approximately 6 months time.   Orders Placed This Encounter  Procedures  . CBC with Differential ([facility] Only)    Standing Status:   Future    Number of Occurrences:   1    Standing Expiration Date:   02/27/2022  . CMP ([place] only)    Standing Status:   Future    Number of Occurrences:   1    Standing Expiration Date:   02/27/2022  . Vitamin B12    Standing Status:   Future    Number of Occurrences:   1     Standing Expiration Date:   02/27/2022  . Folate, Serum    Standing Status:   Future    Number of Occurrences:   1    Standing Expiration Date:   02/27/2022  . Hepatitis B core antibody, total    Standing Status:   Future    Number of Occurrences:   1    Standing Expiration Date:   02/27/2022  . Hepatitis B surface antibody    Standing Status:   Future    Number of Occurrences:   1    Standing Expiration Date:   02/27/2022  . Hepatitis B surface antigen    Standing Status:   Future    Number of Occurrences:   1    Standing Expiration Date:   02/27/2022  . Hepatitis C antibody    Standing Status:   Future    Number of Occurrences:   1    Standing Expiration Date:   02/27/2022  . HIV antibody (with reflex)    Standing Status:   Future    Number of Occurrences:   1    Standing Expiration Date:   02/27/2022  . Sedimentation rate    Standing Status:   Future    Number of Occurrences:   1    Standing Expiration Date:   02/27/2022  . C-reactive protein    Standing Status:   Future    Number of Occurrences:   1    Standing Expiration Date:   02/27/2022  . Save Smear (SSMR)    Standing Status:   Future    Number of Occurrences:   1    Standing Expiration Date:   02/27/2022    All questions were answered. The patient knows to call the clinic with any problems, questions or concerns.  I have spent a total of 60 minutes minutes of face-to-face and non-face-to-face time, preparing to see the patient, obtaining and/or reviewing separately obtained history, performing a medically appropriate examination, counseling and educating the  patient, ordering tests, referring and documenting clinical information in the electronic health record and care coordination.   Dede Query, PA-C Department of Hematology/Oncology [place] at [facility] Phone: [phone]

## 2021-03-02 ENCOUNTER — Telehealth: Payer: Self-pay | Admitting: Physician Assistant

## 2021-03-02 NOTE — Telephone Encounter (Signed)
Scheduled per los. Called and left msg. Mailed printout  °

## 2021-08-27 ENCOUNTER — Other Ambulatory Visit: Payer: Self-pay | Admitting: Physician Assistant

## 2021-08-27 DIAGNOSIS — D708 Other neutropenia: Secondary | ICD-10-CM

## 2021-08-28 ENCOUNTER — Other Ambulatory Visit: Payer: Self-pay

## 2021-08-28 ENCOUNTER — Inpatient Hospital Stay: Payer: 59 | Attending: Physician Assistant | Admitting: Physician Assistant

## 2021-08-28 ENCOUNTER — Inpatient Hospital Stay: Payer: 59

## 2021-08-28 VITALS — BP 139/85 | HR 66 | Temp 98.2°F | Resp 18 | Ht 64.0 in | Wt 156.7 lb

## 2021-08-28 DIAGNOSIS — D708 Other neutropenia: Secondary | ICD-10-CM

## 2021-08-28 DIAGNOSIS — E785 Hyperlipidemia, unspecified: Secondary | ICD-10-CM | POA: Insufficient documentation

## 2021-08-28 DIAGNOSIS — I10 Essential (primary) hypertension: Secondary | ICD-10-CM | POA: Diagnosis not present

## 2021-08-28 DIAGNOSIS — K219 Gastro-esophageal reflux disease without esophagitis: Secondary | ICD-10-CM | POA: Diagnosis not present

## 2021-08-28 DIAGNOSIS — D709 Neutropenia, unspecified: Secondary | ICD-10-CM | POA: Insufficient documentation

## 2021-08-28 LAB — CMP (CANCER CENTER ONLY)
ALT: 12 U/L (ref 0–44)
AST: 22 U/L (ref 15–41)
Albumin: 4.1 g/dL (ref 3.5–5.0)
Alkaline Phosphatase: 74 U/L (ref 38–126)
Anion gap: 10 (ref 5–15)
BUN: 16 mg/dL (ref 6–20)
CO2: 27 mmol/L (ref 22–32)
Calcium: 9.4 mg/dL (ref 8.9–10.3)
Chloride: 105 mmol/L (ref 98–111)
Creatinine: 1.25 mg/dL — ABNORMAL HIGH (ref 0.61–1.24)
GFR, Estimated: >60 mL/min (ref >60)
Glucose, Bld: 96 mg/dL (ref 70–99)
Potassium: 3.5 mmol/L (ref 3.5–5.1)
Sodium: 142 mmol/L (ref 135–145)
Total Bilirubin: 1.1 mg/dL (ref 0.3–1.2)
Total Protein: 7.7 g/dL (ref 6.5–8.1)

## 2021-08-28 LAB — CBC WITH DIFFERENTIAL (CANCER CENTER ONLY)
Abs Immature Granulocytes: 0 10*3/uL (ref 0.00–0.07)
Basophils Absolute: 0 10*3/uL (ref 0.0–0.1)
Basophils Relative: 1 %
Eosinophils Absolute: 0 10*3/uL (ref 0.0–0.5)
Eosinophils Relative: 1 %
HCT: 43.4 % (ref 39.0–52.0)
Hemoglobin: 15.5 g/dL (ref 13.0–17.0)
Immature Granulocytes: 0 %
Lymphocytes Relative: 48 %
Lymphs Abs: 1.2 10*3/uL (ref 0.7–4.0)
MCH: 31.1 pg (ref 26.0–34.0)
MCHC: 35.7 g/dL (ref 30.0–36.0)
MCV: 87 fL (ref 80.0–100.0)
Monocytes Absolute: 0.3 10*3/uL (ref 0.1–1.0)
Monocytes Relative: 11 %
Neutro Abs: 0.9 10*3/uL — ABNORMAL LOW (ref 1.7–7.7)
Neutrophils Relative %: 39 %
Platelet Count: 147 10*3/uL — ABNORMAL LOW (ref 150–400)
RBC: 4.99 MIL/uL (ref 4.22–5.81)
RDW: 12.2 % (ref 11.5–15.5)
WBC Count: 2.4 10*3/uL — ABNORMAL LOW (ref 4.0–10.5)
nRBC: 0 % (ref 0.0–0.2)

## 2021-08-28 NOTE — Progress Notes (Signed)
[place] [phone]   Fax:(336) [phone]  PROGRESS NOTE  Patient Care Team: Benito Mccreedy, MD as PCP - General (Internal Medicine)  CHIEF COMPLAINTS/PURPOSE OF CONSULTATION:  "Neutropenia "  HISTORY OF PRESENTING ILLNESS:  Andrew Andrade 55 y.o. male returns for follow-up for neutropenia.  On exam today, Andrew Andrade reports some fatigue but continues to complete all his daily activities independently.  He has a good appetite and denies any changes to his weight.  He denies any nausea, vomiting or abdominal pain.  His bowel habits are fairly unchanged without any diarrhea or constipation.  He recently underwent a colonoscopy on 08/25/2021.  Patient reports they found several polyps that were removed and pathology is pending.  He denies easy bruising or signs of bleeding.  He denies any recent infections.  He denies fevers, chills, night sweats, shortness of breath, chest pain, cough, edema, neuropathy or any other skin changes.  He has no other complaints.  The rest of the 10 point ROS is below.  MEDICAL HISTORY:  Past Medical History:  Diagnosis Date   GERD (gastroesophageal reflux disease)    Hyperlipidemia    Hypertension     SURGICAL HISTORY: Past Surgical History:  Procedure Laterality Date   ESOPHAGEAL MANOMETRY N/A 09/10/2015   Procedure: ESOPHAGEAL MANOMETRY (EM);  Surgeon: Laurence Spates, MD;  [facility];  Service: Endoscopy;  Laterality: N/A;   HELLER MYOTOMY N/A 11/11/2017   Procedure: LAPAROSCOPIC HELLER MYOTOMY;  Surgeon: Johnathan Hausen, MD;  Location: [facility];  Service: General;  Laterality: N/A;   UPPER GI ENDOSCOPY      SOCIAL HISTORY: Social History   Socioeconomic History   Marital status: Married    Spouse name: Not on file   Number of children: Not on file   Years of education: Not on file   Highest education level: Not on file  Occupational History   Not on file  Tobacco Use   Smoking status: Never    Smokeless tobacco: Never  Vaping Use   Vaping Use: Never used  Substance and Sexual Activity   Alcohol use: No   Drug use: No   Sexual activity: Yes  Other Topics Concern   Not on file  Social History Narrative   Marital status: married. From [place]; moved to [place] 25 years ago.      Children:  4 daughters; 1 grandchild.      Lives: with wife, youngest daughter.      Employment: works for city [employer] x 18 years.   Social Determinants of Health   Financial Resource Strain: Not on file  Food Insecurity: Not on file  Transportation Needs: Not on file  Physical Activity: Not on file  Stress: Not on file  Social Connections: Not on file  Intimate Partner Violence: Not on file    FAMILY HISTORY: No family history on file.  ALLERGIES:  has No Known Allergies.  MEDICATIONS:  Current Outpatient Medications  Medication Sig Dispense Refill   amLODipine (NORVASC) 10 MG tablet Take 10 mg by mouth daily.      latanoprost (XALATAN) 0.005 % ophthalmic solution SMARTSIG:In Eye(s)     olmesartan (BENICAR) 40 MG tablet Take 40 mg by mouth daily.   1   No current facility-administered medications for this visit.    REVIEW OF SYSTEMS:   Constitutional: ( - ) fevers, ( - )  chills , ( - ) night sweats Eyes: ( - ) blurriness of vision, ( - )  double vision, ( - ) watery eyes Ears, nose, mouth, throat, and face: ( - ) mucositis, ( - ) sore throat Respiratory: ( - ) cough, ( - ) dyspnea, ( - ) wheezes Cardiovascular: ( - ) palpitation, ( - ) chest discomfort, ( - ) lower extremity swelling Gastrointestinal:  ( - ) nausea, ( - ) heartburn, ( - ) change in bowel habits Skin: ( - ) abnormal skin rashes Lymphatics: ( - ) new lymphadenopathy, ( - ) easy bruising Neurological: ( - ) numbness, ( - ) tingling, ( - ) new weaknesses Behavioral/Psych: ( - ) mood change, ( - ) new changes  All other systems were reviewed with the patient and are negative.  PHYSICAL EXAMINATION: ECOG  PERFORMANCE STATUS: 0 - Asymptomatic  Vitals:   08/28/21 1012  BP: 139/85  Pulse: 66  Resp: 18  Temp: 98.2 F (36.8 C)  SpO2: 99%   Filed Weights   08/28/21 1012  Weight: 156 lb 11.2 oz (71.1 kg)    GENERAL: well appearing male in NAD  SKIN: skin color, texture, turgor are normal, no rashes or significant lesions EYES: conjunctiva are pink and non-injected, sclera clear OROPHARYNX: no exudate, no erythema; lips, buccal mucosa, and tongue normal  NECK: supple, non-tender LYMPH:  no palpable lymphadenopathy in the cervical or supraclavicular lymph nodes.  LUNGS: clear to auscultation and percussion with normal breathing effort HEART: regular rate & rhythm and no murmurs and no lower extremity edema ABDOMEN: soft, non-tender, non-distended, normal bowel sounds Musculoskeletal: no cyanosis of digits and no clubbing  PSYCH: alert & oriented x 3, fluent speech NEURO: no focal motor/sensory deficits  LABORATORY DATA:  I have reviewed the data as listed CBC Latest Ref Rng & Units 08/28/2021 02/27/2021 11/12/2017  WBC 4.0 - 10.5 K/uL 2.4(L) 2.3(L) 7.0  Hemoglobin 13.0 - 17.0 g/dL 15.5 14.9 14.4  Hematocrit 39.0 - 52.0 % 43.4 43.2 42.2  Platelets 150 - 400 K/uL 147(L) 166 135(L)    CMP Latest Ref Rng & Units 08/28/2021 02/27/2021 11/12/2017  Glucose 70 - 99 mg/dL 96 92 130(H)  BUN 6 - 20 mg/dL _0 Creatinine 0.61 - 1.24 mg/dL 1.25(H) 1.16 1.16  Sodium 135 - 145 mmol/L 142 143 137  Potassium 3.5 - 5.1 mmol/L 3.5 3.4(L) 3.8  Chloride 98 - 111 mmol/L 105 104 101  CO2 22 - 32 mmol/L _1 Calcium 8.9 - 10.3 mg/dL 9.4 9.8 9.3  Total Protein 6.5 - 8.1 g/dL 7.7 7.6 -  Total Bilirubin 0.3 - 1.2 mg/dL 1.1 1.1 -  Alkaline Phos 38 - 126 U/L 74 68 -  AST 15 - 41 U/L 22 19 -  ALT 0 - 44 U/L 12 9 -    ASSESSMENT & PLAN Andrew Andrade is a 55 y.o. male presenting to the clinic for evaluation for neutropenia.    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 There is no evidence of vitamin B12 or folate. There is no evidence of inflammatory process with normal CRP and Sed rate.  --Patient does not take medications known to cause neutropenia.  --Labs today were reviewed that shows overall stable neutropenia with mild decrease of ANC to 900.  --Patient denies any recent infections. --Recommend to monitor and return for labs in 3 months and then office visit in 6 months. Consider bone marrow biopsy if ANC  continues to decline.    Orders Placed This Encounter  Procedures   CBC with Differential ([place] Only)    Standing Status:   Future    Standing Expiration Date:   08/28/2022     All questions were answered. The patient knows to call the clinic with any problems, questions or concerns.  I have spent a total of 25 minutes minutes of face-to-face and non-face-to-face time, preparing to see the [place] a medically appropriate examination, counseling and educating the patient, ordering tests, documenting clinical information in the electronic health record, and care coordination.   Dede Query, PA-C Department of Hematology/Oncology [place] at [facility] Phone: [phone]

## 2021-11-27 ENCOUNTER — Inpatient Hospital Stay: Payer: 59 | Attending: Physician Assistant

## 2021-11-27 ENCOUNTER — Other Ambulatory Visit: Payer: Self-pay

## 2021-11-27 DIAGNOSIS — D708 Other neutropenia: Secondary | ICD-10-CM

## 2021-11-27 DIAGNOSIS — D709 Neutropenia, unspecified: Secondary | ICD-10-CM | POA: Diagnosis not present

## 2021-11-27 LAB — CBC WITH DIFFERENTIAL (CANCER CENTER ONLY)
Abs Immature Granulocytes: 0.01 10*3/uL (ref 0.00–0.07)
Basophils Absolute: 0 10*3/uL (ref 0.0–0.1)
Basophils Relative: 1 %
Eosinophils Absolute: 0.1 10*3/uL (ref 0.0–0.5)
Eosinophils Relative: 2 %
HCT: 40.6 % (ref 39.0–52.0)
Hemoglobin: 13.9 g/dL (ref 13.0–17.0)
Immature Granulocytes: 0 %
Lymphocytes Relative: 47 %
Lymphs Abs: 1.5 10*3/uL (ref 0.7–4.0)
MCH: 30.2 pg (ref 26.0–34.0)
MCHC: 34.2 g/dL (ref 30.0–36.0)
MCV: 88.3 fL (ref 80.0–100.0)
Monocytes Absolute: 0.4 10*3/uL (ref 0.1–1.0)
Monocytes Relative: 11 %
Neutro Abs: 1.3 10*3/uL — ABNORMAL LOW (ref 1.7–7.7)
Neutrophils Relative %: 39 %
Platelet Count: 146 10*3/uL — ABNORMAL LOW (ref 150–400)
RBC: 4.6 MIL/uL (ref 4.22–5.81)
RDW: 12.3 % (ref 11.5–15.5)
WBC Count: 3.3 10*3/uL — ABNORMAL LOW (ref 4.0–10.5)
nRBC: 0 % (ref 0.0–0.2)

## 2021-11-29 ENCOUNTER — Encounter: Payer: Self-pay | Admitting: Hematology and Oncology

## 2021-12-06 ENCOUNTER — Encounter (HOSPITAL_COMMUNITY): Payer: Self-pay | Admitting: *Deleted

## 2021-12-06 ENCOUNTER — Other Ambulatory Visit: Payer: Self-pay

## 2021-12-06 ENCOUNTER — Ambulatory Visit (HOSPITAL_COMMUNITY)
Admission: EM | Admit: 2021-12-06 | Discharge: 2021-12-06 | Disposition: A | Discharge status: Home / Self Care | Payer: 59 | Attending: Family Medicine | Admitting: Family Medicine

## 2021-12-06 ENCOUNTER — Ambulatory Visit (INDEPENDENT_AMBULATORY_CARE_PROVIDER_SITE_OTHER): Payer: 59

## 2021-12-06 DIAGNOSIS — M79641 Pain in right hand: Secondary | ICD-10-CM

## 2021-12-06 MED ORDER — IBUPROFEN 800 MG PO TABS: 800.0000 mg | ORAL_TABLET | Freq: Three times a day (TID) | ORAL | 0 refills | Status: AC | PRN

## 2021-12-06 NOTE — Discharge Instructions (Addendum)
Trays did not show any broken bones  Ibuprofen 800 mg, 1 tablet every 8 hours as needed for pain  You can ice and elevate your right hand that is sore.
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
## 2022-02-24 ENCOUNTER — Other Ambulatory Visit: Payer: Self-pay

## 2022-02-24 DIAGNOSIS — D708 Other neutropenia: Secondary | ICD-10-CM

## 2022-02-26 ENCOUNTER — Inpatient Hospital Stay: Payer: 59 | Admitting: Hematology and Oncology

## 2022-02-26 ENCOUNTER — Other Ambulatory Visit: Payer: Self-pay

## 2022-02-26 ENCOUNTER — Inpatient Hospital Stay: Payer: 59 | Attending: Physician Assistant

## 2022-02-26 VITALS — BP 149/84 | HR 67 | Temp 97.9°F | Resp 15 | Wt 163.7 lb

## 2022-02-26 DIAGNOSIS — D708 Other neutropenia: Secondary | ICD-10-CM | POA: Diagnosis not present

## 2022-02-26 DIAGNOSIS — D709 Neutropenia, unspecified: Secondary | ICD-10-CM | POA: Insufficient documentation

## 2022-02-26 LAB — CMP (CANCER CENTER ONLY)
ALT: 25 U/L (ref 0–44)
AST: 28 U/L (ref 15–41)
Albumin: 4.3 g/dL (ref 3.5–5.0)
Alkaline Phosphatase: 78 U/L (ref 38–126)
Anion gap: 8 (ref 5–15)
BUN: 16 mg/dL (ref 6–20)
CO2: 29 mmol/L (ref 22–32)
Calcium: 9.5 mg/dL (ref 8.9–10.3)
Chloride: 103 mmol/L (ref 98–111)
Creatinine: 1.15 mg/dL (ref 0.61–1.24)
GFR, Estimated: >60 mL/min (ref >60)
Glucose, Bld: 100 mg/dL — ABNORMAL HIGH (ref 70–99)
Potassium: 3.5 mmol/L (ref 3.5–5.1)
Sodium: 140 mmol/L (ref 135–145)
Total Bilirubin: 1.4 mg/dL — ABNORMAL HIGH (ref 0.3–1.2)
Total Protein: 7.7 g/dL (ref 6.5–8.1)

## 2022-02-26 LAB — CBC WITH DIFFERENTIAL (CANCER CENTER ONLY)
Abs Immature Granulocytes: 0.01 10*3/uL (ref 0.00–0.07)
Basophils Absolute: 0 10*3/uL (ref 0.0–0.1)
Basophils Relative: 0 %
Eosinophils Absolute: 0.1 10*3/uL (ref 0.0–0.5)
Eosinophils Relative: 3 %
HCT: 42 % (ref 39.0–52.0)
Hemoglobin: 14.9 g/dL (ref 13.0–17.0)
Immature Granulocytes: 0 %
Lymphocytes Relative: 43 %
Lymphs Abs: 1.1 10*3/uL (ref 0.7–4.0)
MCH: 31.1 pg (ref 26.0–34.0)
MCHC: 35.5 g/dL (ref 30.0–36.0)
MCV: 87.7 fL (ref 80.0–100.0)
Monocytes Absolute: 0.4 10*3/uL (ref 0.1–1.0)
Monocytes Relative: 13 %
Neutro Abs: 1.1 10*3/uL — ABNORMAL LOW (ref 1.7–7.7)
Neutrophils Relative %: 41 %
Platelet Count: 153 10*3/uL (ref 150–400)
RBC: 4.79 MIL/uL (ref 4.22–5.81)
RDW: 12.4 % (ref 11.5–15.5)
WBC Count: 2.7 10*3/uL — ABNORMAL LOW (ref 4.0–10.5)
nRBC: 0 % (ref 0.0–0.2)

## 2022-03-07 NOTE — Progress Notes (Signed)
[place] [phone]   [phone]  PROGRESS NOTE  Patient Care Team: Benito Mccreedy, MD as PCP - General (Internal Medicine)  CHIEF COMPLAINTS/PURPOSE OF CONSULTATION:  "Neutropenia "  HISTORY OF PRESENTING ILLNESS:  Andrew Andrade 56 y.o. male returns for follow-up for neutropenia.  On exam today, Mr. Stecher reports has been well overall interim since her last visit.  He reports that he did have a sinus infection which was successfully treated with antibiotics in the interim since her last visit.  He reports that he has had no issues with pneumonias or other viruses.  He reports his energy is quite low and that overall he feels tired.  He reports that he has an unrestricted diet and eats pretty much everything including red meat.  He notes that otherwise there have been no other changes in his health in the interim since our last visit.  He denies fevers, chills, night sweats, shortness of breath, chest pain, cough, edema, neuropathy or any other skin changes.  He has no other complaints.  The rest of the 10 point ROS is below.  MEDICAL HISTORY:  Past Medical History:  Diagnosis Date   GERD (gastroesophageal reflux disease)    Hyperlipidemia    Hypertension     SURGICAL HISTORY: Past Surgical History:  Procedure Laterality Date   ESOPHAGEAL MANOMETRY N/A 09/10/2015   Procedure: ESOPHAGEAL MANOMETRY (EM);  Surgeon: Laurence Spates, MD;  Location: [facility];  Service: Endoscopy;  Laterality: N/A;   HELLER MYOTOMY N/A 11/11/2017   Procedure: LAPAROSCOPIC HELLER MYOTOMY;  Surgeon: Johnathan Hausen, MD;  Location: [facility];  Service: General;  Laterality: N/A;   UPPER GI ENDOSCOPY      SOCIAL HISTORY: Social History   Socioeconomic History   Marital status: Married    Spouse name: Not on file   Number of children: Not on file   Years of education: Not on file   Highest education level: Not on file  Occupational History   Not on file   Tobacco Use   Smoking status: Never   Smokeless tobacco: Never  Vaping Use   Vaping Use: Never used  Substance and Sexual Activity   Alcohol use: No   Drug use: No   Sexual activity: Not on file  Other Topics Concern   Not on file  Social History Narrative   Marital status: married. From [place]; moved to [place] 25 years ago.      Children:  4 daughters; 1 grandchild.      Lives: with wife, youngest daughter.      Employment: works for city of [employer] x 18 years.   Social Determinants of Health   Financial Resource Strain: Not on file  Food Insecurity: Not on file  Transportation Needs: Not on file  Physical Activity: Not on file  Stress: Not on file  Social Connections: Not on file  Intimate Partner Violence: Not on file    FAMILY HISTORY: No family history on file.  ALLERGIES:  has No Known Allergies.  MEDICATIONS:  Current Outpatient Medications  Medication Sig Dispense Refill   amLODipine (NORVASC) 10 MG tablet Take 10 mg by mouth daily.      atorvastatin (LIPITOR) 40 MG tablet Take 40 mg by mouth at bedtime.     gabapentin (NEURONTIN) 300 MG capsule 1 cap(s) orally once a day at bedtime for 30 day(s)     ibuprofen (ADVIL) 800 MG tablet Take 1 tablet (800 mg total) by mouth every 8 (  eight) hours as needed (pain). 21 tablet 0   latanoprost (XALATAN) 0.005 % ophthalmic solution SMARTSIG:In Eye(s)     olmesartan (BENICAR) 40 MG tablet Take 40 mg by mouth daily.   1   pantoprazole (PROTONIX) 40 MG tablet Take 40 mg by mouth daily.     VITAMIN D PO Take by mouth.     ATORVASTATIN CALCIUM PO Take by mouth. (Patient not taking: Reported on 02/26/2022)     Pantoprazole Sodium (PROTONIX PO) Take by mouth.     pantoprazole sodium (PROTONIX) 40 mg 1 tab(s) orally Once daily for 90 days     No current facility-administered medications for this visit.    REVIEW OF SYSTEMS:   Constitutional: ( - ) fevers, ( - )  chills , ( - ) night sweats Eyes: ( - ) blurriness of  vision, ( - ) double vision, ( - ) watery eyes Ears, nose, mouth, throat, and face: ( - ) mucositis, ( - ) sore throat Respiratory: ( - ) cough, ( - ) dyspnea, ( - ) wheezes Cardiovascular: ( - ) palpitation, ( - ) chest discomfort, ( - ) lower extremity swelling Gastrointestinal:  ( - ) nausea, ( - ) heartburn, ( - ) change in bowel habits Skin: ( - ) abnormal skin rashes Lymphatics: ( - ) new lymphadenopathy, ( - ) easy bruising Neurological: ( - ) numbness, ( - ) tingling, ( - ) new weaknesses Behavioral/Psych: ( - ) mood change, ( - ) new changes  All other systems were reviewed with the patient and are negative.  PHYSICAL EXAMINATION: ECOG PERFORMANCE STATUS: 0 - Asymptomatic  Vitals:   02/26/22 1329  BP: (!) 149/84  Pulse: 67  Resp: 15  Temp: 97.9 F (36.6 C)  SpO2: 97%   Filed Weights   02/26/22 1329  Weight: 163 lb 11.2 oz (74.3 kg)    GENERAL: well appearing male in NAD  SKIN: skin color, texture, turgor are normal, no rashes or significant lesions EYES: conjunctiva are pink and non-injected, sclera clear LUNGS: clear to auscultation and percussion with normal breathing effort HEART: regular rate & rhythm and no murmurs and no lower extremity edema Musculoskeletal: no cyanosis of digits and no clubbing  PSYCH: alert & oriented x 3, fluent speech NEURO: no focal motor/sensory deficits  LABORATORY DATA:  I have reviewed the data as listed    Latest Ref Rng & Units 02/26/2022   12:57 PM 11/27/2021    8:59 AM 08/28/2021    9:55 AM  CBC  WBC 4.0 - 10.5 K/uL 2.7   3.3   2.4    Hemoglobin 13.0 - 17.0 g/dL 14.9   13.9   15.5    Hematocrit 39.0 - 52.0 % 42.0   40.6   43.4    Platelets 150 - 400 K/uL 153   146   147         Latest Ref Rng & Units 02/26/2022   12:57 PM 08/28/2021    9:55 AM 02/27/2021   10:26 AM  CMP  Glucose 70 - 99 mg/dL 100   96   92    BUN 6 - 20 mg/dL '16   16   15    ' Creatinine 0.61 - 1.24 mg/dL 1.15   1.25   1.16    Sodium 135 - 145 mmol/L  140   142   143    Potassium 3.5 - 5.1 mmol/L 3.5   3.5   3.4  Chloride 98 - 111 mmol/L 103   105   104    CO2 22 - 32 mmol/L '29   27   29    ' Calcium 8.9 - 10.3 mg/dL 9.5   9.4   9.8    Total Protein 6.5 - 8.1 g/dL 7.7   7.7   7.6    Total Bilirubin 0.3 - 1.2 mg/dL 1.4   1.1   1.1    Alkaline Phos 38 - 126 U/L 78   74   68    AST 15 - 41 U/L '28   22   19    ' ALT 0 - 44 U/L '25   12   9      ' ASSESSMENT & PLAN ZYMIRE TURNBO is a 56 y.o. male presenting to the clinic for evaluation for neutropenia.    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 There is no evidence of vitamin B12 or folate. There is no evidence of inflammatory process with normal CRP and Sed rate.  --Patient does not take medications known to cause neutropenia.  --Labs today were reviewed that shows overall stable neutropenia with an Wilson of 1100 --Patient denies any recent infections. --Recommend to monitor and return for labs in 12 months. Consider bone marrow biopsy if ANC continues to decline.   No orders of the defined types were placed in this encounter.  All questions were answered. The patient knows to call the clinic with any problems, questions or concerns.  I have spent a total of 25 minutes minutes of face-to-face and non-face-to-face time, preparing to see the [place] a medically appropriate examination, counseling and educating the patient, ordering tests, documenting clinical information in the electronic health record, and care coordination.   Ledell Peoples, MD Department of Hematology/Oncology [place] [facility] Phone: [phone] Pager: [phone] Email: [email or web address]
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
## 2023-03-02 ENCOUNTER — Other Ambulatory Visit: Payer: Self-pay | Admitting: Physician Assistant

## 2023-03-02 ENCOUNTER — Inpatient Hospital Stay: Payer: 59 | Admitting: Physician Assistant

## 2023-03-02 ENCOUNTER — Inpatient Hospital Stay: Payer: 59 | Attending: Internal Medicine

## 2023-03-02 DIAGNOSIS — D708 Other neutropenia: Secondary | ICD-10-CM

## 2023-04-01 ENCOUNTER — Telehealth: Payer: Self-pay | Admitting: Hematology and Oncology

## 2023-04-22 ENCOUNTER — Other Ambulatory Visit: Payer: Self-pay

## 2023-04-22 ENCOUNTER — Inpatient Hospital Stay: Payer: 59 | Attending: Internal Medicine

## 2023-04-22 ENCOUNTER — Inpatient Hospital Stay: Payer: 59 | Admitting: Hematology and Oncology

## 2023-04-22 VITALS — BP 154/92 | HR 66 | Temp 98.2°F | Resp 18 | Ht 64.0 in | Wt 157.8 lb

## 2023-04-22 DIAGNOSIS — D708 Other neutropenia: Secondary | ICD-10-CM

## 2023-04-22 DIAGNOSIS — D709 Neutropenia, unspecified: Secondary | ICD-10-CM | POA: Diagnosis present

## 2023-04-22 LAB — CBC WITH DIFFERENTIAL (CANCER CENTER ONLY)
Abs Immature Granulocytes: 0.01 10*3/uL (ref 0.00–0.07)
Basophils Absolute: 0 10*3/uL (ref 0.0–0.1)
Basophils Relative: 0 %
Eosinophils Absolute: 0 10*3/uL (ref 0.0–0.5)
Eosinophils Relative: 1 %
HCT: 41.8 % (ref 39.0–52.0)
Hemoglobin: 14.5 g/dL (ref 13.0–17.0)
Immature Granulocytes: 0 %
Lymphocytes Relative: 39 %
Lymphs Abs: 1 10*3/uL (ref 0.7–4.0)
MCH: 30.9 pg (ref 26.0–34.0)
MCHC: 34.7 g/dL (ref 30.0–36.0)
MCV: 88.9 fL (ref 80.0–100.0)
Monocytes Absolute: 0.3 10*3/uL (ref 0.1–1.0)
Monocytes Relative: 13 %
Neutro Abs: 1.1 10*3/uL — ABNORMAL LOW (ref 1.7–7.7)
Neutrophils Relative %: 47 %
Platelet Count: 144 10*3/uL — ABNORMAL LOW (ref 150–400)
RBC: 4.7 MIL/uL (ref 4.22–5.81)
RDW: 12.7 % (ref 11.5–15.5)
WBC Count: 2.4 10*3/uL — ABNORMAL LOW (ref 4.0–10.5)
nRBC: 0 % (ref 0.0–0.2)

## 2023-04-22 LAB — CMP (CANCER CENTER ONLY)
ALT: 12 U/L (ref 0–44)
AST: 20 U/L (ref 15–41)
Albumin: 4.3 g/dL (ref 3.5–5.0)
Alkaline Phosphatase: 70 U/L (ref 38–126)
Anion gap: 4 — ABNORMAL LOW (ref 5–15)
BUN: 15 mg/dL (ref 6–20)
CO2: 34 mmol/L — ABNORMAL HIGH (ref 22–32)
Calcium: 9.8 mg/dL (ref 8.9–10.3)
Chloride: 101 mmol/L (ref 98–111)
Creatinine: 1.16 mg/dL (ref 0.61–1.24)
GFR, Estimated: >60 mL/min (ref >60)
Glucose, Bld: 89 mg/dL (ref 70–99)
Potassium: 3.7 mmol/L (ref 3.5–5.1)
Sodium: 139 mmol/L (ref 135–145)
Total Bilirubin: 0.9 mg/dL (ref 0.3–1.2)
Total Protein: 7.4 g/dL (ref 6.5–8.1)

## 2023-04-22 NOTE — Progress Notes (Signed)
[facility] Telephone:([phone]   Fax:([phone]  PROGRESS NOTE  Patient Care Team: Jackie Plum, MD as PCP - General (Internal Medicine)  CHIEF COMPLAINTS/PURPOSE OF CONSULTATION:  "Neutropenia "  HISTORY OF PRESENTING ILLNESS:  Andrew Andrade 57 y.o. male returns for follow-up for neutropenia.  On exam today, Andrew Andrade reports he has been well overall in the interim since her last visit.  He does have some occasional bouts of fatigue.  And he does note that he has poor endurance and get tired quickly while walking up steps.  He notes that he is eating well.  He has not been having any trouble with fevers, chills, sweats.  He denies any runny nose, sore throat, or cough.  His weight has been steady and he has had no focal infections.  He denies fevers, chills, night sweats, shortness of breath, chest pain, cough, edema, neuropathy or any other skin changes.  He has no other complaints.  The rest of the 10 point ROS is below.  MEDICAL HISTORY:  Past Medical History:  Diagnosis Date   GERD (gastroesophageal reflux disease)    Hyperlipidemia    Hypertension     SURGICAL HISTORY: Past Surgical History:  Procedure Laterality Date   ESOPHAGEAL MANOMETRY N/A 09/10/2015   Procedure: ESOPHAGEAL MANOMETRY (EM);  Surgeon: Carman Ching, MD;  Location: [facility];  Service: Endoscopy;  Laterality: N/A;   HELLER MYOTOMY N/A 11/11/2017   Procedure: LAPAROSCOPIC HELLER MYOTOMY;  Surgeon: Luretha Murphy, MD;  Location: [facility];  Service: General;  Laterality: N/A;   UPPER GI ENDOSCOPY      SOCIAL HISTORY: Social History   Socioeconomic History   Marital status: Married    Spouse name: Not on file   Number of children: Not on file   Years of education: Not on file   Highest education level: Not on file  Occupational History   Not on file  Tobacco Use   Smoking status: Never   Smokeless tobacco: Never  Vaping Use   Vaping status: Never Used  Substance  and Sexual Activity   Alcohol use: No   Drug use: No   Sexual activity: Not on file  Other Topics Concern   Not on file  Social History Narrative   Marital status: married. From [place]; moved to [place] 25 years ago.      Children:  4 daughters; 1 grandchild.      Lives: with wife, youngest daughter.      Employment: works for city of [employer] x 18 years.   Social Determinants of Health   Financial Resource Strain: Not on file  Food Insecurity: Not on file  Transportation Needs: Not on file  Physical Activity: Not on file  Stress: Not on file  Social Connections: Not on file  Intimate Partner Violence: Not on file    FAMILY HISTORY: No family history on file.  ALLERGIES:  has No Known Allergies.  MEDICATIONS:  Current Outpatient Medications  Medication Sig Dispense Refill   amLODipine (NORVASC) 10 MG tablet Take 10 mg by mouth daily.      atorvastatin (LIPITOR) 40 MG tablet Take 40 mg by mouth at bedtime.     ATORVASTATIN CALCIUM PO Take by mouth. (Patient not taking: Reported on 02/26/2022)     gabapentin (NEURONTIN) 300 MG capsule 1 cap(s) orally once a day at bedtime for 30 day(s)     ibuprofen (ADVIL) 800 MG tablet Take 1 tablet (800 mg total) by mouth every 8 (  eight) hours as needed (pain). 21 tablet 0   latanoprost (XALATAN) 0.005 % ophthalmic solution SMARTSIG:In Eye(s)     olmesartan (BENICAR) 40 MG tablet Take 40 mg by mouth daily.   1   pantoprazole (PROTONIX) 40 MG tablet Take 40 mg by mouth daily.     Pantoprazole Sodium (PROTONIX PO) Take by mouth.     pantoprazole sodium (PROTONIX) 40 mg 1 tab(s) orally Once daily for 90 days     VITAMIN D PO Take by mouth.     No current facility-administered medications for this visit.    REVIEW OF SYSTEMS:   Constitutional: ( - ) fevers, ( - )  chills , ( - ) night sweats Eyes: ( - ) blurriness of vision, ( - ) double vision, ( - ) watery eyes Ears, nose, mouth, throat, and face: ( - ) mucositis, ( - ) sore  throat Respiratory: ( - ) cough, ( - ) dyspnea, ( - ) wheezes Cardiovascular: ( - ) palpitation, ( - ) chest discomfort, ( - ) lower extremity swelling Gastrointestinal:  ( - ) nausea, ( - ) heartburn, ( - ) change in bowel habits Skin: ( - ) abnormal skin rashes Lymphatics: ( - ) new lymphadenopathy, ( - ) easy bruising Neurological: ( - ) numbness, ( - ) tingling, ( - ) new weaknesses Behavioral/Psych: ( - ) mood change, ( - ) new changes  All other systems were reviewed with the patient and are negative.  PHYSICAL EXAMINATION: ECOG PERFORMANCE STATUS: 0 - Asymptomatic  There were no vitals filed for this visit.  There were no vitals filed for this visit.   GENERAL: well appearing male in NAD  SKIN: skin color, texture, turgor are normal, no rashes or significant lesions EYES: conjunctiva are pink and non-injected, sclera clear LUNGS: clear to auscultation and percussion with normal breathing effort HEART: regular rate & rhythm and no murmurs and no lower extremity edema Musculoskeletal: no cyanosis of digits and no clubbing  PSYCH: alert & oriented x 3, fluent speech NEURO: no focal motor/sensory deficits  LABORATORY DATA:  I have reviewed the data as listed    Latest Ref Rng & Units 02/26/2022   12:57 PM 11/27/2021    8:59 AM 08/28/2021    9:55 AM  CBC  WBC 4.0 - 10.5 K/uL 2.7  3.3  2.4   Hemoglobin 13.0 - 17.0 g/dL 45.4  09.8  11.9   Hematocrit 39.0 - 52.0 % 42.0  40.6  43.4   Platelets 150 - 400 K/uL 153  146  147        Latest Ref Rng & Units 02/26/2022   12:57 PM 08/28/2021    9:55 AM 02/27/2021   10:26 AM  CMP  Glucose 70 - 99 mg/dL 147  96  92   BUN 6 - 20 mg/dL 16  16  15    Creatinine 0.61 - 1.24 mg/dL 8.29  5.62  1.30   Sodium 135 - 145 mmol/L 140  142  143   Potassium 3.5 - 5.1 mmol/L 3.5  3.5  3.4   Chloride 98 - 111 mmol/L 103  105  104   CO2 22 - 32 mmol/L 29  27  29    Calcium 8.9 - 10.3 mg/dL 9.5  9.4  9.8   Total Protein 6.5 - 8.1 g/dL 7.7  7.7   7.6   Total Bilirubin 0.3 - 1.2 mg/dL 1.4  1.1  1.1   Alkaline Phos 38 -  126 U/L 78  74  68   AST 15 - 41 U/L 28  22  19    ALT 0 - 44 U/L 25  12  9      ASSESSMENT & PLAN Andrew Andrade is a 57 y.o. male presenting to the clinic for evaluation for neutropenia.    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 There is no evidence of vitamin B12 or folate. There is no evidence of inflammatory process with normal CRP and Sed rate.  --Patient does not take medications known to cause neutropenia.  --Labs today show stable labs with white blood cell count 2.4, hemoglobin 14.5, MCV 88.9, and platelets of 114 with neutrophils of 1100. --Patient denies any recent infections. --Will order a liver ultrasound in order to rule out underlying liver disease. --Recommend to monitor labs with PCP and return for any new/worsening issues with his labs.   No orders of the defined types were placed in this encounter.  All questions were answered. The patient knows to call the clinic with any problems, questions or concerns.  I have spent a total of 25 minutes minutes of face-to-face and non-face-to-face time, preparing to see the patient,performing a medically appropriate examination, counseling and educating the patient, ordering tests, documenting clinical information in the electronic health record, and care coordination.   Ulysees Barns, MD Department of Hematology/Oncology [facility] Phone: [phone] Pager: [phone] Email: [email or web address]

## 2023-04-29 ENCOUNTER — Ambulatory Visit (HOSPITAL_COMMUNITY)
Admission: RE | Admit: 2023-04-29 | Discharge: 2023-04-29 | Disposition: A | Discharge status: Home / Self Care | Payer: 59 | Source: Ambulatory Visit | Attending: Hematology and Oncology | Admitting: Hematology and Oncology

## 2023-04-29 DIAGNOSIS — D708 Other neutropenia: Secondary | ICD-10-CM | POA: Insufficient documentation

## 2023-05-02 ENCOUNTER — Telehealth: Payer: Self-pay | Admitting: *Deleted

## 2023-05-02 NOTE — Telephone Encounter (Signed)
TCT patient regarding results of recent US. Spoke with pt and advised that his [place] did not show any liver disease. There is no need for routine f/u in our clinic unless his blood counts were to worsen. Pt vopiced understanding.

## 2023-05-02 NOTE — Telephone Encounter (Signed)
-----   Message from Ulysees Barns IV sent at 04/29/2023 12:54 PM EDT ----- Please let Mr. Bailly know that his [place] did not show any liver disease. There is no need for routine f/u in our clinic unless his blood counts were to worsen. ----- Message ----- From: Interface, Rad Results In Sent: 04/29/2023  10:27 AM EDT To: Jaci Standard, MD

## 2023-07-19 ENCOUNTER — Other Ambulatory Visit: Payer: Self-pay | Admitting: Urology

## 2023-07-20 LAB — PSA: Prostate Specific Ag, Serum: 1.3 ng/mL (ref 0.0–4.0)

## 2024-08-20 ENCOUNTER — Other Ambulatory Visit: Payer: Self-pay | Admitting: Student

## 2024-08-20 DIAGNOSIS — R634 Abnormal weight loss: Secondary | ICD-10-CM

## 2024-08-24 ENCOUNTER — Ambulatory Visit
Admission: RE | Admit: 2024-08-24 | Discharge: 2024-08-24 | Disposition: A | Discharge status: Home / Self Care | Source: Ambulatory Visit | Attending: Student | Admitting: Student

## 2024-08-24 DIAGNOSIS — R634 Abnormal weight loss: Secondary | ICD-10-CM

## 2024-08-24 MED ORDER — IOPAMIDOL (ISOVUE-300) INJECTION 61%
100.0000 mL | Freq: Once | INTRAVENOUS | Status: AC | PRN
  Administered 2024-08-24: 100 mL via INTRAVENOUS
